# Patient Record
Sex: Female | Born: 1995 | Race: Black or African American | Hispanic: No | Marital: Single | Smoking: Never smoker
Health system: Southern US, Community
[De-identification: ages and names within clinical notes are randomized; demographics above are authoritative.]

## PROBLEM LIST (undated history)

## (undated) ENCOUNTER — Emergency Department (HOSPITAL_COMMUNITY): Discharge status: Against Medical Advice | Payer: BC Managed Care – HMO

## (undated) DIAGNOSIS — N63 Unspecified lump in unspecified breast: Secondary | ICD-10-CM

## (undated) DIAGNOSIS — K121 Other forms of stomatitis: Secondary | ICD-10-CM

## (undated) HISTORY — PX: NO PAST SURGERIES: SHX2092

## (undated) HISTORY — DX: Other forms of stomatitis: K12.1

---
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
## 2014-04-04 ENCOUNTER — Emergency Department (HOSPITAL_COMMUNITY)
Admission: EM | Admit: 2014-04-04 | Discharge: 2014-04-05 | Disposition: A | Discharge status: Home / Self Care | Payer: BLUE CROSS/BLUE SHIELD | Attending: Emergency Medicine | Admitting: Emergency Medicine

## 2014-04-04 ENCOUNTER — Encounter (HOSPITAL_COMMUNITY): Payer: Self-pay | Admitting: Emergency Medicine

## 2014-04-04 DIAGNOSIS — Z79899 Other long term (current) drug therapy: Secondary | ICD-10-CM | POA: Insufficient documentation

## 2014-04-04 DIAGNOSIS — Z720 Tobacco use: Secondary | ICD-10-CM | POA: Insufficient documentation

## 2014-04-04 DIAGNOSIS — R000 Tachycardia, unspecified: Secondary | ICD-10-CM | POA: Insufficient documentation

## 2014-04-04 DIAGNOSIS — K121 Other forms of stomatitis: Secondary | ICD-10-CM | POA: Insufficient documentation

## 2014-04-04 DIAGNOSIS — Z8619 Personal history of other infectious and parasitic diseases: Secondary | ICD-10-CM | POA: Insufficient documentation

## 2014-04-04 DIAGNOSIS — K1379 Other lesions of oral mucosa: Secondary | ICD-10-CM | POA: Diagnosis present

## 2014-04-04 DIAGNOSIS — Z3202 Encounter for pregnancy test, result negative: Secondary | ICD-10-CM | POA: Diagnosis not present

## 2014-04-04 LAB — BASIC METABOLIC PANEL
Anion gap: 9 (ref 5–15)
BUN: 7 mg/dL (ref 6–23)
CO2: 25 mmol/L (ref 19–32)
Calcium: 9.9 mg/dL (ref 8.4–10.5)
Chloride: 102 mmol/L (ref 96–112)
Creatinine, Ser: 0.87 mg/dL (ref 0.50–1.10)
GFR calc Af Amer: >90 mL/min (ref >90)
GFR calc non Af Amer: >90 mL/min (ref >90)
Glucose, Bld: 88 mg/dL (ref 70–99)
Potassium: 3.7 mmol/L (ref 3.5–5.1)
Sodium: 136 mmol/L (ref 135–145)

## 2014-04-04 LAB — CBC WITH DIFFERENTIAL/PLATELET
Basophils Absolute: 0 10*3/uL (ref 0.0–0.1)
Basophils Relative: 0 % (ref 0–1)
Eosinophils Absolute: 0.8 10*3/uL — ABNORMAL HIGH (ref 0.0–0.7)
Eosinophils Relative: 8 % — ABNORMAL HIGH (ref 0–5)
HCT: 36.7 % (ref 36.0–46.0)
Hemoglobin: 12.1 g/dL (ref 12.0–15.0)
Lymphocytes Relative: 14 % (ref 12–46)
Lymphs Abs: 1.4 10*3/uL (ref 0.7–4.0)
MCH: 27.3 pg (ref 26.0–34.0)
MCHC: 33 g/dL (ref 30.0–36.0)
MCV: 82.8 fL (ref 78.0–100.0)
Monocytes Absolute: 0.7 10*3/uL (ref 0.1–1.0)
Monocytes Relative: 7 % (ref 3–12)
Neutro Abs: 7.4 10*3/uL (ref 1.7–7.7)
Neutrophils Relative %: 71 % (ref 43–77)
Platelets: 391 10*3/uL (ref 150–400)
RBC: 4.43 MIL/uL (ref 3.87–5.11)
RDW: 12.9 % (ref 11.5–15.5)
WBC: 10.4 10*3/uL (ref 4.0–10.5)

## 2014-04-04 MED ORDER — HYDROMORPHONE HCL 1 MG/ML IJ SOLN
1.0000 mg | Freq: Once | INTRAMUSCULAR | Status: AC
  Administered 2014-04-04: 1 mg via INTRAVENOUS
  Filled 2014-04-04: qty 1

## 2014-04-04 MED ORDER — MAGIC MOUTHWASH W/LIDOCAINE: 5.0000 mL | Freq: Three times a day (TID) | ORAL | Status: DC | PRN

## 2014-04-04 MED ORDER — DEXTROSE 5 % AND 0.45 % NACL IV BOLUS
1000.0000 mL | Freq: Once | INTRAVENOUS | Status: AC
  Administered 2014-04-04: 1000 mL via INTRAVENOUS

## 2014-04-04 MED ORDER — HYDROCODONE-ACETAMINOPHEN 7.5-325 MG/15ML PO SOLN: 5.0000 mL | ORAL | Status: DC | PRN

## 2014-04-04 MED ORDER — MAGIC MOUTHWASH
5.0000 mL | Freq: Once | ORAL | Status: AC
  Administered 2014-04-04: 5 mL via ORAL
  Filled 2014-04-04 (×2): qty 5

## 2014-04-04 MED ORDER — ACETAMINOPHEN 325 MG PO TABS
650.0000 mg | ORAL_TABLET | Freq: Four times a day (QID) | ORAL | Status: DC | PRN
  Administered 2014-04-04: 650 mg via ORAL
  Filled 2014-04-04: qty 2

## 2014-04-04 MED ORDER — KETOROLAC TROMETHAMINE 15 MG/ML IJ SOLN
15.0000 mg | Freq: Once | INTRAMUSCULAR | Status: AC
  Administered 2014-04-04: 15 mg via INTRAVENOUS
  Filled 2014-04-04: qty 1

## 2014-04-04 NOTE — ED Provider Notes (Signed)
CSN: [id number]     Arrival date & time 04/04/14  1947 History   First MD Initiated Contact with Patient 04/04/14 2050     Chief Complaint  Patient presents with  . Oral Swelling     (Consider location/radiation/quality/duration/timing/severity/associated sxs/prior Treatment) HPI   18yF with mouth pain/sores. Has hx of herpes stomatitis and reports that she gets recurrence ~2x year. Most recently began about 2 weeks ago. Has been taking acyclovir without improvement. Eating/drinking but decreased 2/2 pain. Intermittent subjective fever. No respiratory complaints. No other rash/lesions aside from lips/mouth.   Past Medical History  Diagnosis Date  . Herpes simplex infection    History reviewed. No pertinent past surgical history. History reviewed. No pertinent family history. History  Substance Use Topics  . Smoking status: Current Some Day Smoker  . Smokeless tobacco: Not on file  . Alcohol Use: No   OB History    No data available     Review of Systems  All systems reviewed and negative, other than as noted in HPI.   Allergies  Tomato  Home Medications   Prior to Admission medications   Medication Sig Start Date End Date Taking? Authorizing Provider  acyclovir (ZOVIRAX) 200 MG capsule Take 1 capsule (200 mg total) by mouth 5 (five) times daily. For three more days. 09/27/13   Jennifer L Piepenbrink, PA-C  HYDROcodone-acetaminophen (HYCET) 7.5-325 mg/15 ml solution Take 15 mLs by mouth every 8 (eight) hours as needed for moderate pain. 09/27/13   Jennifer L Piepenbrink, PA-C  lidocaine (XYLOCAINE) 2 % solution Use as directed 20 mLs in the mouth or throat as needed for mouth pain. 09/27/13   Jennifer L Piepenbrink, PA-C   BP 128/78 mmHg  Pulse 128  Temp(Src) 100 F (37.8 C) (Oral)  Resp 18  SpO2 98%  LMP 03/14/2014 Physical Exam  Constitutional: She appears well-developed and well-nourished. No distress.  HENT:  Head: Normocephalic and atraumatic.  Superficial  ulcerations to lips and oral mucosa. Posterior pharynx somewhat injected but without obvious lesion. Handling secretions. Pt speaking with minimizing lip movement because of pain. Can open mouth fully/no trismus. Neck supple. No nodes.   Eyes: Conjunctivae are normal. Right eye exhibits no discharge. Left eye exhibits no discharge.  Neck: Neck supple.  Cardiovascular: Regular rhythm and normal heart sounds.  Exam reveals no gallop and no friction rub.   No murmur heard. tachycardic  Pulmonary/Chest: Effort normal and breath sounds normal. No respiratory distress.  Abdominal: Soft. She exhibits no distension. There is no tenderness.  Musculoskeletal: She exhibits no edema or tenderness.  Neurological: She is alert.  Skin: Skin is warm and dry.  Psychiatric: She has a normal mood and affect. Her behavior is normal. Thought content normal.  Nursing note and vitals reviewed.   ED Course  Procedures (including critical care time) Labs Review Labs Reviewed  CBC WITH DIFFERENTIAL/PLATELET - Abnormal; Notable for the following:    Eosinophils Relative 8 (*)    Eosinophils Absolute 0.8 (*)    All other components within normal limits  BASIC METABOLIC PANEL  POC URINE PREG, ED    Imaging Review No results found.   EKG Interpretation None      MDM   Final diagnoses:  Stomatitis    18yF with stomatitis. Poor PO intake 2/2 this which I suspect is reason for tachycardia. Hx of same. Handling secretions. IVF. Pain meds. On acyclovir. Add magic mouth wash/PRN pain meds.     Raeford RazorStephen Mckensie Scotti, MD 04/12/14 1006

## 2014-04-04 NOTE — Discharge Instructions (Signed)
Stomatitis Stomatitis is an inflammation of the mucous lining of the mouth. It can affect part of the mouth or the whole mouth. The intensity of symptoms can range from mild to severe. It can affect your cheek, teeth, gums, lips, or tongue. In almost all cases, the lining of the mouth becomes swollen, red, and painful. Painful ulcers can develop in your mouth. Stomatitis recurs in some people. CAUSES  There are many common causes of stomatitis. They include: Viruses (such as cold sores or shingles). Canker sores. Bacteria (such as ulcerative gingivitis or sexually transmitted diseases). Fungus or yeast (such as candidiasis or oral thrush). Poor oral hygiene and poor nutrition (Vincent's stomatitis or trench mouth). Lack of vitamin B, vitamin C, or niacin. Dentures or braces that do not fit properly. High acid foods (uncommon). Sharp or broken teeth. Cheek biting. Breathing through the mouth. Chewing tobacco. Allergy to toothpaste, mouthwash, candy, gum, lipstick, or some medicines. Burning your mouth with hot drinks or food. Exposure to dyes, heavy metals, acid fumes, or mineral dust. SYMPTOMS  Painful ulcers in the mouth. Blisters in the mouth. Bleeding gums. Swollen gums. Irritability. Bad breath. Bad taste in the mouth. Fever. Trouble eating because of burning and pain in the mouth. DIAGNOSIS  Your caregiver will examine your mouth and look for bleeding gums and mouth ulcers. Your caregiver may ask you about the medicines you are taking. Your caregiver may suggest a blood test and tissue sample (biopsy) of the mouth ulcer or mass if either is present. This will help find the cause of your condition. TREATMENT  Your treatment will depend on the cause of your condition. Your caregiver will first try to treat your symptoms.  You may be given pain medicine. Topical anesthetic may be used to numb the area if you have severe pain. Your caregiver may prescribe antibiotic medicine if  you have a bacterial infection. Your caregiver may prescribe antifungal medicine if you have a fungal infection. You may need to take antiviral medicine if you have a viral infection like herpes. You may be asked to use medicated mouth rinses. Your caregiver will advise you about proper brushing and using a soft toothbrush. You also need to get your teeth cleaned regularly. HOME CARE INSTRUCTIONS  Maintain good oral hygiene. This is especially important for transplant patients. Brush your teeth carefully with a soft, nylon-bristled toothbrush. Floss at least 2 times a day. Clean your mouth after eating. Rinse your mouth with salt water 3 to 4 times a day. Gargle with cold water. Use topical numbing medicines to decrease pain if recommended by your caregiver. Stop smoking, and stop using chewing or smokeless tobacco. Avoid eating hot and spicy foods. Eat soft and bland food. Reduce your stress wherever possible. Eat healthy and nutritious foods. SEEK MEDICAL CARE IF:  Your symptoms persist or get worse. You develop new symptoms. Your mouth ulcers are present for more than 3 weeks. Your mouth ulcers come back frequently. You have increasing difficulty with normal eating and drinking. You have increasing fatigue or weakness. You develop loss of appetite or nausea. SEEK IMMEDIATE MEDICAL CARE IF:  You have a fever. You develop pain, redness, or sores around one or both eyes. You cannot eat or drink because of pain or other symptoms. You develop worsening weakness, or you faint. You develop vomiting or diarrhea. You develop chest pain, shortness of breath, or rapid and irregular heartbeats. MAKE SURE YOU: Understand these instructions. Will watch your condition. Will get help  right away if you are not doing well or get worse. Document Released: 11/24/2006 Document Revised: 04/21/2011 Document Reviewed: 09/05/2010 [facility] Patient Information 2015 [place], [place]. This information is  not intended to replace advice given to you by your health care provider. Make sure you discuss any questions you have with your health care provider.  ove with pain medicine.  You have a lot of bleeding in your mouth.  You develop new, open sores in your mouth.  You notice patches of pus forming in your mouth.  You cannot swallow solid food or liquids.  You have a fever. Document Released: 09/13/2010 Document Revised: 04/21/2011 Document Reviewed: 09/13/2010 [facility] Patient Information 2015 [place], [place]. This information is not intended to replace advice given to you by your health care provider. Make sure you discuss any questions you have with your health care provider.

## 2014-04-04 NOTE — ED Notes (Signed)
Pt reports twice a years she develops sores in mouth. Pt states she is on medication with no relief. Pt has lip swelling with blisters upon assessment. Pt denies any SOB.

## 2014-04-05 MED ORDER — MAGIC MOUTHWASH W/LIDOCAINE: 5.0000 mL | Freq: Three times a day (TID) | ORAL | Status: DC | PRN

## 2014-04-05 MED ORDER — HYDROCODONE-ACETAMINOPHEN 7.5-325 MG/15ML PO SOLN
5.0000 mL | ORAL | Status: AC | PRN
Start: 2014-04-05 — End: 2015-04-05

## 2015-03-27 ENCOUNTER — Ambulatory Visit (INDEPENDENT_AMBULATORY_CARE_PROVIDER_SITE_OTHER): Payer: BLUE CROSS/BLUE SHIELD | Admitting: Family Medicine

## 2015-03-27 VITALS — BP 116/75 | HR 79 | Temp 98.3°F | Resp 16 | Ht 66.0 in | Wt 184.4 lb

## 2015-03-27 DIAGNOSIS — Z0000 Encounter for general adult medical examination without abnormal findings: Secondary | ICD-10-CM | POA: Diagnosis not present

## 2015-03-27 DIAGNOSIS — Z113 Encounter for screening for infections with a predominantly sexual mode of transmission: Secondary | ICD-10-CM

## 2015-03-27 DIAGNOSIS — Z789 Other specified health status: Secondary | ICD-10-CM

## 2015-03-27 NOTE — Progress Notes (Addendum)
[facility] [address] [place] [address] [phone]  Date:  03/27/2015   Name:  Stephanie Sanford   DOB:  12-31-1995   MRN:  [id number]  PCP:  No PCP Per Patient    Chief Complaint: Annual Exam   History of Present Illness:  Stephanie Sanford is a 20 y.o. 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  Would like STI screening but does not desire any other testing or evaluation today  She does not have any significant health problems, no significant health history.    Non smoker, no alcohol, no drugs  LMP 2/7 No problems with lifting or other physical activity that may be required to work with children She declines a flu shot today  There are no active problems to display for this patient.   Past Medical History  Diagnosis Date  . Herpes simplex infection     History reviewed. No pertinent past surgical history.  Social History  Substance Use Topics  . Smoking status: Never Smoker   . Smokeless tobacco: None  . Alcohol Use: No    History reviewed. No pertinent family history.  Allergies  Allergen Reactions  . Tomato Hives and Swelling    Medication list has been reviewed and updated.  Current Outpatient Prescriptions on File Prior to Visit  Medication Sig Dispense Refill  . acyclovir (ZOVIRAX) 800 MG tablet Take 800 mg by mouth 2 (two) times daily. For 5 days.    . Alum & Mag Hydroxide-Simeth (MAGIC MOUTHWASH W/LIDOCAINE) SOLN Take 5 mLs by mouth 3 (three) times daily as needed for mouth pain. 234 mL 0  . lidocaine (XYLOCAINE) 2 % solution Use as directed 20 mLs in the mouth or throat as needed for mouth pain. 100 mL 0  . acetaminophen (TYLENOL) 500 MG tablet Take 500 mg by mouth every 6 (six) hours as needed for mild pain. Reported on 03/27/2015    . acyclovir (ZOVIRAX) 200 MG capsule  Take 1 capsule (200 mg total) by mouth 5 (five) times daily. For three more days. (Patient not taking: Reported on 03/27/2015) 15 capsule 0  . HYDROcodone-acetaminophen (HYCET) 7.5-325 mg/15 ml solution Take 5-10 mLs by mouth every 4 (four) hours as needed for moderate pain. (Patient not taking: Reported on 03/27/2015) 120 mL 0  . medroxyPROGESTERone (DEPO-PROVERA) 150 MG/ML injection Inject 150 mg into the muscle every 3 (three) months. Reported on 03/27/2015     No current facility-administered medications on file prior to visit.    Review of Systems:  As per HPI- otherwise negative.   Physical Examination: Filed Vitals:   03/27/15 1137  BP: 116/75  Pulse: 79  Temp: 98.3 F (36.8 C)  Resp: 16   Filed Vitals:   03/27/15 1137  Height:  (1.676 m)  Weight: 184 lb 6.4 oz (83.643 kg)   Body mass index is 29.78 kg/(m^2). Ideal Body Weight: Weight in (lb) to have BMI = 25: 154.6  GEN: WDWN, NAD, Non-toxic, A & O x 3, overweight, looks well HEENT: Atraumatic, Normocephalic. Neck supple. No masses, No LAD.  Bilateral TM wnl, oropharynx normal.  PEERL,EOMI.   Ears and Nose: No external deformity. CV: RRR, No M/G/R. No JVD. No thrill. No extra heart sounds. PULM: CTA B, no wheezes, crackles, rhonchi. No retractions. No resp. distress.  No accessory muscle use. ABD: S, NT, ND EXTR: No c/c/e Normal strength and DTR of all extremities  NEURO Normal gait.  PSYCH: Normally interactive. Conversant. Not depressed or anxious appearing.  Calm demeanor.    Assessment and Plan: Screening for STD (sexually transmitted disease) - Plan: GC/Chlamydia Probe Amp, HIV antibody, RPR, Hepatitis B surface antibody, Hepatitis B surface antigen, Hepatitis C antibody  Physical exam  PE today, completed form STI panel pending and I will be in touch with her labs She declines a flu shot today Declines flu shot today Signed Abbe Amsterdam, MD  Called and [facility] on 2/16- labs negative will send a  copy  Results for orders placed or performed in visit on 03/27/15  GC/Chlamydia Probe Amp  Result Value Ref Range   CT Probe RNA NOT DETECTED    GC Probe RNA NOT DETECTED   HIV antibody  Result Value Ref Range   HIV 1&2 Ab, 4th Generation NONREACTIVE NONREACTIVE  RPR  Result Value Ref Range   RPR Ser Ql NON REAC NON REAC  Hepatitis B surface antibody  Result Value Ref Range   Hepatitis B-Post 37.5 mIU/mL  Hepatitis B surface antigen  Result Value Ref Range   Hepatitis B Surface Ag NEGATIVE NEGATIVE  Hepatitis C antibody  Result Value Ref Range   HCV Ab NEGATIVE NEGATIVE   Called on 2/16-

## 2015-03-27 NOTE — Patient Instructions (Signed)
It was good to see you today- best of luck with the rest of your education!  I would encourage you to get an annual flu shot in the future to help protect yourself and others. Take care!

## 2015-03-28 LAB — RPR

## 2015-03-28 LAB — HEPATITIS C ANTIBODY: HCV Ab: NEGATIVE

## 2015-03-28 LAB — HEPATITIS B SURFACE ANTIGEN: HEP B S AG: NEGATIVE

## 2015-03-28 LAB — HIV ANTIBODY (ROUTINE TESTING W REFLEX): HIV 1&2 Ab, 4th Generation: NONREACTIVE

## 2015-03-28 LAB — HEPATITIS B SURFACE ANTIBODY, QUANTITATIVE: Hepatitis B-Post: 37.5 m[IU]/mL

## 2015-03-29 ENCOUNTER — Encounter: Payer: Self-pay | Admitting: Family Medicine

## 2015-03-29 DIAGNOSIS — Z789 Other specified health status: Secondary | ICD-10-CM | POA: Insufficient documentation

## 2015-03-29 LAB — GC/CHLAMYDIA PROBE AMP
CT PROBE, AMP APTIMA: NOT DETECTED
GC PROBE AMP APTIMA: NOT DETECTED

## 2016-02-11 NOTE — L&D Delivery Note (Signed)
Delivery Note Patient is a 21 y.o. now [id number] who admitted for SOL, now s/p NSVD at [id number] At 1:28 PM  Patient complete and pushing. Head delivered LOA. No nuchal cord present. Shoulder and body delivered in usual fashion. Infant to mother's abdomen. Cord clamped x 2 after 1-minute delay, and cut by family member. Cord blood drawn. Placenta delivered spontaneously with gentle cord traction. Fundus firm with massage and Pitocin. Perineum inspected and found to have no lacerations. L labial laceration noted to be hemostatic.  APGAR: 8, 9; weight  pending Placenta status: intact   Cord:  3-vessel  Anesthesia:  Epidural Episiotomy: None Lacerations:Labial Suture Repair: none Est. Blood Loss (mL):  150  Mom to postpartum.  Baby to Couplet care / Skin to Skin.  Kandra NicolasJulie P Degele 09/02/2016, 1:47 PM

## 2016-07-10 ENCOUNTER — Ambulatory Visit (INDEPENDENT_AMBULATORY_CARE_PROVIDER_SITE_OTHER): Payer: Medicaid Other | Admitting: Certified Nurse Midwife

## 2016-07-10 ENCOUNTER — Encounter: Payer: Self-pay | Admitting: Certified Nurse Midwife

## 2016-07-10 ENCOUNTER — Other Ambulatory Visit: Payer: Medicaid Other

## 2016-07-10 VITALS — BP 104/68 | HR 99 | Wt 195.0 lb

## 2016-07-10 DIAGNOSIS — Z3483 Encounter for supervision of other normal pregnancy, third trimester: Secondary | ICD-10-CM

## 2016-07-10 DIAGNOSIS — D573 Sickle-cell trait: Secondary | ICD-10-CM | POA: Insufficient documentation

## 2016-07-10 DIAGNOSIS — R87612 Low grade squamous intraepithelial lesion on cytologic smear of cervix (LGSIL): Secondary | ICD-10-CM

## 2016-07-10 DIAGNOSIS — N6310 Unspecified lump in the right breast, unspecified quadrant: Secondary | ICD-10-CM

## 2016-07-10 DIAGNOSIS — Z3490 Encounter for supervision of normal pregnancy, unspecified, unspecified trimester: Secondary | ICD-10-CM

## 2016-07-10 DIAGNOSIS — B002 Herpesviral gingivostomatitis and pharyngotonsillitis: Secondary | ICD-10-CM

## 2016-07-10 DIAGNOSIS — O219 Vomiting of pregnancy, unspecified: Secondary | ICD-10-CM

## 2016-07-10 HISTORY — DX: Low grade squamous intraepithelial lesion on cytologic smear of cervix (LGSIL): R87.612

## 2016-07-10 HISTORY — DX: Herpesviral gingivostomatitis and pharyngotonsillitis: B00.2

## 2016-07-10 MED ORDER — ONDANSETRON HCL 8 MG PO TABS: 8.0000 mg | ORAL_TABLET | Freq: Three times a day (TID) | ORAL | 2 refills | Status: DC | PRN

## 2016-07-10 MED ORDER — PRENATE PIXIE 10-0.6-0.4-200 MG PO CAPS: 1.0000 | ORAL_CAPSULE | Freq: Every day | ORAL | 12 refills | Status: DC

## 2016-07-10 NOTE — Progress Notes (Signed)
Subjective:    Stephanie Sanford is being seen today for her first obstetrical visit.  This is a planned pregnancy. She is at [redacted]w[redacted]d gestation. Her obstetrical history is significant for obesity. Relationship with FOB: significant other, living together. Patient does intend to breast feed. Pregnancy history fully reviewed.  The information documented in the HPI was reviewed and verified.  Menstrual History: OB History    Gravida Para Term Preterm AB Living   4       3     SAB TAB Ectopic Multiple Live Births   2 1            Patient's last menstrual period was 11/30/2015.    Past Medical History:  Diagnosis Date  . Mouth ulcers     History reviewed. No pertinent surgical history.   (Not in a hospital admission) Allergies  Allergen Reactions  . Tomato Hives and Swelling    Social History  Substance Use Topics  . Smoking status: Never Smoker  . Smokeless tobacco: Never Used  . Alcohol use No    History reviewed. No pertinent family history.   Review of Systems Constitutional: negative for weight loss Gastrointestinal: negative for vomiting Genitourinary:negative for genital lesions and vaginal discharge and dysuria Musculoskeletal:negative for back pain Behavioral/Psych: negative for abusive relationship, depression, illegal drug usage and tobacco use    Objective:    BP 104/68   Pulse 99   Wt 195 lb (88.5 kg)   LMP 11/30/2015   BMI 31.47 kg/m  General Appearance:    Alert, cooperative, no distress, appears stated age  Head:    Normocephalic, without obvious abnormality, atraumatic  Eyes:    PERRL, conjunctiva/corneas clear, EOM's intact, fundi    benign, both eyes  Ears:    Normal TM's and external ear canals, both ears  Nose:   Nares normal, septum midline, mucosa normal, no drainage    or sinus tenderness  Throat:   Lips, mucosa, and tongue normal; teeth and gums normal  Neck:   Supple, symmetrical, trachea midline, no adenopathy;    thyroid:  no  enlargement/tenderness/nodules; no carotid   bruit or JVD  Back:     Symmetric, no curvature, ROM normal, no CVA tenderness  Lungs:     Clear to auscultation bilaterally, respirations unlabored  Chest Wall:    No tenderness or deformity   Heart:    Regular rate and rhythm, S1 and S2 normal, no murmur, rub   or gallop  Breast Exam:    No tenderness, masses, or nipple abnormality on left side.  Right side mass noted sternal boarder 3 o'clock region.   Abdomen:     Soft, non-tender, bowel sounds active all four quadrants,    no masses, no organomegaly  Genitalia:    Normal female without lesion, discharge or tenderness  Extremities:   Extremities normal, atraumatic, no cyanosis or edema  Pulses:   2+ and symmetric all extremities  Skin:   Skin color, texture, turgor normal, no rashes or lesions  Lymph nodes:   Cervical, supraclavicular, and axillary nodes normal  Neurologic:   CNII-XII intact, normal strength, sensation and reflexes    throughout      Lab Review Urine pregnancy test Labs reviewed yes Radiologic studies reviewed no Assessment & Plan    Pregnancy at [redacted]w[redacted]d weeks    1. Encounter for supervision of normal pregnancy, antepartum, unspecified gravidity      Doing well.  - Glucose Tolerance, 2 Hours w/1 Hour - [place]  MFM OB COMP + 14 WK; Future - Varicella zoster antibody, IgG - Culture, OB Urine - Hemoglobin A1c - Obstetric Panel, Including HIV - Cystic Fibrosis Mutation 97 - Prenat-FeAsp-Meth-FA-DHA w/o A (PRENATE PIXIE) 10-0.6-0.4-200 MG CAPS; Take 1 tablet by mouth daily.  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 Sickle cell trait (HCC)      FOB has not been tested previously  4. Breast mass, right     Hematoma, f/u scheduled for August  5. Oral herpes     Takes Acyclovir for it occasionally, no outbreaks this pregnancy  6. Nausea and vomiting during pregnancy     Has tried Diclegis -  ondansetron (ZOFRAN) 8 MG tablet; Take 1 tablet (8 mg total) by mouth every 8 (eight) hours as needed for nausea or vomiting.  Dispense: 40 tablet; Refill: 2   Prenatal vitamins.  Counseling provided regarding continued use of seat belts, cessation of alcohol consumption, smoking or use of illicit drugs; infection precautions i.e., influenza/TDAP immunizations, toxoplasmosis,CMV, parvovirus, listeria and varicella; workplace safety, exercise during pregnancy; routine dental care, safe medications, sexual activity, hot tubs, saunas, pools, travel, caffeine use, fish and methlymercury, potential toxins, hair treatments, varicose veins Weight gain recommendations per IOM guidelines reviewed: underweight/BMI< 18.5--> gain 28 - 40 lbs; normal weight/BMI 18.5 - 24.9--> gain 25 - 35 lbs; overweight/BMI 25 - 29.9--> gain 15 - 25 lbs; obese/BMI >30->gain  11 - 20 lbs Problem list reviewed and updated. FIRST/CF mutation testing/NIPT/QUAD SCREEN/fragile X/Ashkenazi Jewish population testing/Spinal muscular atrophy discussed: ordered. Role of ultrasound in pregnancy discussed; fetal survey: ordered. Amniocentesis discussed: not indicated.   Meds ordered this encounter  Medications  . Prenatal Vit-Fe Fumarate-FA (MULTIVITAMIN-PRENATAL) 27-0.8 MG TABS tablet    Sig: Take 1 tablet by mouth daily at 12 noon.  . ondansetron (ZOFRAN) 8 MG tablet    Sig: Take 1 tablet (8 mg total) by mouth every 8 (eight) hours as needed for nausea or vomiting.    Dispense:  40 tablet    Refill:  2  . Prenat-FeAsp-Meth-FA-DHA w/o A (PRENATE PIXIE) 10-0.6-0.4-200 MG CAPS    Sig: Take 1 tablet by mouth daily.    Dispense:  30 capsule    Refill:  12    Please process coupon: Rx BIN: [id number], RxPCN: [id number], RxGRP: [id number], Rx: [id number]: [id number]  SUF: 01   Orders Placed This Encounter  Procedures  . Culture, OB Urine  . US MFM OB COMP + 14 WK    Standing Status:   Future    Standing Expiration Date:   09/09/2017    Order Specific  Question:   Reason for Exam (SYMPTOM  OR DIAGNOSIS REQUIRED)    Answer:   fetal anatomy scan    Order Specific Question:   Preferred imaging location?    Answer:   [facility]  . Glucose Tolerance, 2 Hours w/1 Hour  . Varicella zoster antibody, IgG  . Hemoglobin A1c  . Obstetric Panel, Including HIV  . Cystic Fibrosis Mutation 97  . MaterniT21 PLUS Core+SCA    Order Specific Question:   Is the patient insulin dependent?    Answer:   No    Order Specific Question:   Please enter gestational age. This should be expressed as weeks AND days, i.e. 16w 6d. Enter weeks here. Enter days in next question.    Answer:   4631    Order Specific Question:  Please enter gestational age. This should be expressed as weeks AND days, i.e. 16w 6d. Enter days here. Enter weeks in previous question.    Answer:   6    Order Specific Question:   How was gestational age calculated?    Answer:   LMP    Order Specific Question:   Please give the date of LMP OR Ultrasound OR Estimated date of delivery.    Answer:   09/05/2016    Order Specific Question:   Number of Fetuses (Type of Pregnancy):    Answer:   1    Order Specific Question:   Indications for performing the test? (please choose all that apply):    Answer:   Routine screening    Order Specific Question:   Other Indications? (Y=Yes, N=No)    Answer:   N    Order Specific Question:   If this is a repeat specimen, please indicate the reason:    Answer:   Not indicated    Order Specific Question:   Please specify the patient's race: (C=White/Caucasion, B=Black, I=Native American, A=Asian, H=Hispanic, O=Other, U=Unknown)    Answer:   B    Order Specific Question:   Donor Egg - indicate if the egg was obtained from in vitro fertilization.    Answer:   N    Order Specific Question:   Age of Egg Donor.    Answer:   89    Order Specific Question:   Prior Down Syndrome/ONTD screening during current pregnancy.    Answer:   N    Order Specific Question:    Prior First Trimester Testing    Answer:   N    Order Specific Question:   Prior Second Trimester Testing    Answer:   N    Order Specific Question:   Family History of Neural Tube Defects    Answer:   N    Order Specific Question:   Prior Pregnancy with Down Syndrome    Answer:   N    Order Specific Question:   Please give the patient's weight (in pounds)    Answer:   195    Follow up in 2 weeks. 50% of 30 min visit spent on counseling and coordination of care.

## 2016-07-11 LAB — OBSTETRIC PANEL, INCLUDING HIV
Antibody Screen: NEGATIVE
BASOS ABS: 0 10*3/uL (ref 0.0–0.2)
Basos: 0 %
EOS (ABSOLUTE): 0.5 10*3/uL — ABNORMAL HIGH (ref 0.0–0.4)
Eos: 6 %
HEP B S AG: NEGATIVE
HIV SCREEN 4TH GENERATION: NONREACTIVE
Hematocrit: 29.1 % — ABNORMAL LOW (ref 34.0–46.6)
Hemoglobin: 9.5 g/dL — ABNORMAL LOW (ref 11.1–15.9)
Immature Grans (Abs): 0 10*3/uL (ref 0.0–0.1)
Immature Granulocytes: 0 %
Lymphocytes Absolute: 1.6 10*3/uL (ref 0.7–3.1)
Lymphs: 19 %
MCH: 26.5 pg — ABNORMAL LOW (ref 26.6–33.0)
MCHC: 32.6 g/dL (ref 31.5–35.7)
MCV: 81 fL (ref 79–97)
MONOCYTES: 7 %
Monocytes Absolute: 0.6 10*3/uL (ref 0.1–0.9)
NEUTROS ABS: 5.5 10*3/uL (ref 1.4–7.0)
Neutrophils: 68 %
PLATELETS: 356 10*3/uL (ref 150–379)
RBC: 3.58 x10E6/uL — ABNORMAL LOW (ref 3.77–5.28)
RDW: 13.2 % (ref 12.3–15.4)
RPR Ser Ql: NONREACTIVE
RUBELLA: 4.51 {index} (ref >0.99)
Rh Factor: POSITIVE
WBC: 8.3 10*3/uL (ref 3.4–10.8)

## 2016-07-11 LAB — GLUCOSE TOLERANCE, 2 HOURS W/ 1HR
Glucose, 1 hour: 122 mg/dL (ref 65–179)
Glucose, 2 hour: 94 mg/dL (ref 65–152)
Glucose, Fasting: 78 mg/dL (ref 65–91)

## 2016-07-11 LAB — HEMOGLOBIN A1C
ESTIMATED AVERAGE GLUCOSE: 94 mg/dL
Hgb A1c MFr Bld: 4.9 % (ref 4.8–5.6)

## 2016-07-11 LAB — VARICELLA ZOSTER ANTIBODY, IGG: Varicella zoster IgG: 540 index (ref >165)

## 2016-07-13 LAB — URINE CULTURE, OB REFLEX

## 2016-07-13 LAB — CULTURE, OB URINE

## 2016-07-14 ENCOUNTER — Other Ambulatory Visit: Payer: Self-pay | Admitting: Certified Nurse Midwife

## 2016-07-14 DIAGNOSIS — Z3480 Encounter for supervision of other normal pregnancy, unspecified trimester: Secondary | ICD-10-CM

## 2016-07-14 DIAGNOSIS — O99013 Anemia complicating pregnancy, third trimester: Secondary | ICD-10-CM

## 2016-07-14 MED ORDER — CITRANATAL BLOOM 90-1 MG PO TABS: 1.0000 | ORAL_TABLET | Freq: Every day | ORAL | 12 refills | Status: DC

## 2016-07-15 ENCOUNTER — Encounter (HOSPITAL_COMMUNITY): Payer: Self-pay | Admitting: Certified Nurse Midwife

## 2016-07-16 ENCOUNTER — Other Ambulatory Visit: Payer: Self-pay | Admitting: Certified Nurse Midwife

## 2016-07-16 DIAGNOSIS — Z3480 Encounter for supervision of other normal pregnancy, unspecified trimester: Secondary | ICD-10-CM

## 2016-07-16 LAB — CYSTIC FIBROSIS MUTATION 97: GENE DIS ANAL CARRIER INTERP BLD/T-IMP: NOT DETECTED

## 2016-07-19 LAB — MATERNIT21 PLUS CORE+SCA
CHROMOSOME 18: NEGATIVE
CHROMOSOME 21: NEGATIVE
Chromosome 13: NEGATIVE
Y CHROMOSOME: DETECTED

## 2016-07-21 ENCOUNTER — Other Ambulatory Visit: Payer: Self-pay | Admitting: Certified Nurse Midwife

## 2016-07-21 DIAGNOSIS — Z3480 Encounter for supervision of other normal pregnancy, unspecified trimester: Secondary | ICD-10-CM

## 2016-07-22 ENCOUNTER — Ambulatory Visit (HOSPITAL_COMMUNITY)
Admission: RE | Admit: 2016-07-22 | Discharge: 2016-07-22 | Disposition: A | Discharge status: Home / Self Care | Payer: Medicaid Other | Source: Ambulatory Visit | Attending: Certified Nurse Midwife | Admitting: Certified Nurse Midwife

## 2016-07-22 DIAGNOSIS — Z3A33 33 weeks gestation of pregnancy: Secondary | ICD-10-CM | POA: Diagnosis not present

## 2016-07-22 DIAGNOSIS — Z3490 Encounter for supervision of normal pregnancy, unspecified, unspecified trimester: Secondary | ICD-10-CM | POA: Diagnosis not present

## 2016-07-23 ENCOUNTER — Other Ambulatory Visit: Payer: Self-pay | Admitting: Certified Nurse Midwife

## 2016-07-23 DIAGNOSIS — Z3480 Encounter for supervision of other normal pregnancy, unspecified trimester: Secondary | ICD-10-CM

## 2016-07-25 ENCOUNTER — Encounter: Payer: Self-pay | Admitting: Certified Nurse Midwife

## 2016-07-25 ENCOUNTER — Ambulatory Visit (INDEPENDENT_AMBULATORY_CARE_PROVIDER_SITE_OTHER): Payer: Medicaid Other | Admitting: Certified Nurse Midwife

## 2016-07-25 VITALS — BP 116/72 | HR 101 | Wt 198.0 lb

## 2016-07-25 DIAGNOSIS — D573 Sickle-cell trait: Secondary | ICD-10-CM

## 2016-07-25 DIAGNOSIS — Z3483 Encounter for supervision of other normal pregnancy, third trimester: Secondary | ICD-10-CM

## 2016-07-25 DIAGNOSIS — R87612 Low grade squamous intraepithelial lesion on cytologic smear of cervix (LGSIL): Secondary | ICD-10-CM

## 2016-07-25 DIAGNOSIS — O99013 Anemia complicating pregnancy, third trimester: Secondary | ICD-10-CM

## 2016-07-25 DIAGNOSIS — Z3490 Encounter for supervision of normal pregnancy, unspecified, unspecified trimester: Secondary | ICD-10-CM

## 2016-07-25 NOTE — Patient Instructions (Addendum)
Contraception Choices Contraception (birth control) is the use of any methods or devices to prevent pregnancy. Below are some methods to help avoid pregnancy. Hormonal methods  Contraceptive implant. This is a thin, plastic tube containing progesterone hormone. It does not contain estrogen hormone. Your health care provider inserts the tube in the inner part of the upper arm. The tube can remain in place for up to 3 years. After 3 years, the implant must be removed. The implant prevents the ovaries from releasing an egg (ovulation), thickens the cervical mucus to prevent sperm from entering the uterus, and thins the lining of the inside of the uterus.  Progesterone-only injections. These injections are given every 3 months by your health care provider to prevent pregnancy. This synthetic progesterone hormone stops the ovaries from releasing eggs. It also thickens cervical mucus and changes the uterine lining. This makes it harder for sperm to survive in the uterus.  Birth control pills. These pills contain estrogen and progesterone hormone. They work by preventing the ovaries from releasing eggs (ovulation). They also cause the cervical mucus to thicken, preventing the sperm from entering the uterus. Birth control pills are prescribed by a health care provider.Birth control pills can also be used to treat heavy periods.  Minipill. This type of birth control pill contains only the progesterone hormone. They are taken every day of each month and must be prescribed by your health care provider.  Birth control patch. The patch contains hormones similar to those in birth control pills. It must be changed once a week and is prescribed by a health care provider.  Vaginal ring. The ring contains hormones similar to those in birth control pills. It is left in the vagina for 3 weeks, removed for 1 week, and then a new one is put back in place. The patient must be comfortable inserting and removing the ring from  the vagina.A health care provider's prescription is necessary.  Emergency contraception. Emergency contraceptives prevent pregnancy after unprotected sexual intercourse. This pill can be taken right after sex or up to 5 days after unprotected sex. It is most effective the sooner you take the pills after having sexual intercourse. Most emergency contraceptive pills are available without a prescription. Check with your pharmacist. Do not use emergency contraception as your only form of birth control. Barrier methods  Female condom. This is a thin sheath (latex or rubber) that is worn over the penis during sexual intercourse. It can be used with spermicide to increase effectiveness.  Female condom. This is a soft, loose-fitting sheath that is put into the vagina before sexual intercourse.  Diaphragm. This is a soft, latex, dome-shaped barrier that must be fitted by a health care provider. It is inserted into the vagina, along with a spermicidal jelly. It is inserted before intercourse. The diaphragm should be left in the vagina for 6 to 8 hours after intercourse.  Cervical cap. This is a round, soft, latex or plastic cup that fits over the cervix and must be fitted by a health care provider. The cap can be left in place for up to 48 hours after intercourse.  Sponge. This is a soft, circular piece of polyurethane foam. The sponge has spermicide in it. It is inserted into the vagina after wetting it and before sexual intercourse.  Spermicides. These are chemicals that kill or block sperm from entering the cervix and uterus. They come in the form of creams, jellies, suppositories, foam, or tablets. They do not require a prescription. They   are inserted into the vagina with an applicator before having sexual intercourse. The process must be repeated every time you have sexual intercourse. Intrauterine contraception  Intrauterine device (IUD). This is a T-shaped device that is put in a woman's uterus during  a menstrual period to prevent pregnancy. There are 2 types: ? Copper IUD. This type of IUD is wrapped in copper wire and is placed inside the uterus. Copper makes the uterus and fallopian tubes produce a fluid that kills sperm. It can stay in place for 10 years. ? Hormone IUD. This type of IUD contains the hormone progestin (synthetic progesterone). The hormone thickens the cervical mucus and prevents sperm from entering the uterus, and it also thins the uterine lining to prevent implantation of a fertilized egg. The hormone can weaken or kill the sperm that get into the uterus. It can stay in place for 3-5 years, depending on which type of IUD is used. Permanent methods of contraception  Female tubal ligation. This is when the woman's fallopian tubes are surgically sealed, tied, or blocked to prevent the egg from traveling to the uterus.  Hysteroscopic sterilization. This involves placing a small coil or insert into each fallopian tube. Your doctor uses a technique called hysteroscopy to do the procedure. The device causes scar tissue to form. This results in permanent blockage of the fallopian tubes, so the sperm cannot fertilize the egg. It takes about 3 months after the procedure for the tubes to become blocked. You must use another form of birth control for these 3 months.  Female sterilization. This is when the female has the tubes that carry sperm tied off (vasectomy).This blocks sperm from entering the vagina during sexual intercourse. After the procedure, the man can still ejaculate fluid (semen). Natural planning methods  Natural family planning. This is not having sexual intercourse or using a barrier method (condom, diaphragm, cervical cap) on days the woman could become pregnant.  Calendar method. This is keeping track of the length of each menstrual cycle and identifying when you are fertile.  Ovulation method. This is avoiding sexual intercourse during ovulation.  Symptothermal method.  This is avoiding sexual intercourse during ovulation, using a thermometer and ovulation symptoms.  Post-ovulation method. This is timing sexual intercourse after you have ovulated. Regardless of which type or method of contraception you choose, it is important that you use condoms to protect against the transmission of sexually transmitted infections (STIs). Talk with your health care provider about which form of contraception is most appropriate for you. This information is not intended to replace advice given to you by your health care provider. Make sure you discuss any questions you have with your health care provider. Document Released: 01/27/2005 Document Revised: 07/05/2015 Document Reviewed: 07/22/2012 Elsevier Interactive Patient Education  2017 [employer] Inc[employer]  

## 2016-07-25 NOTE — Progress Notes (Signed)
   PRENATAL VISIT NOTE  Subjective:  Stephanie Sanford is a 21 y.o. G4P0030 at 7622w0d being seen today for ongoing prenatal care.  She is currently monitored for the following issues for this low-risk pregnancy and has Hepatitis B immune; Supervision of normal pregnancy, antepartum; LGSIL on Pap smear of cervix; Sickle cell trait (HCC); Breast mass, right; Oral herpes; and Anemia affecting pregnancy in third trimester on her problem list.  Patient reports no complaints.  Contractions: Not present. Vag. Bleeding: None.  Movement: Present. Denies leaking of fluid.   The following portions of the patient's history were reviewed and updated as appropriate: allergies, current medications, past family history, past medical history, past social history, past surgical history and problem list. Problem list updated.  Objective:   Vitals:   07/25/16 0957  BP: 116/72  Pulse: (!) 101  Weight: 198 lb (89.8 kg)    Fetal Status: Fetal Heart Rate (bpm): 141 Fundal Height: 35 cm Movement: Present     General:  Alert, oriented and cooperative. Patient is in no acute distress.  Skin: Skin is warm and dry. No rash noted.   Cardiovascular: Normal heart rate noted  Respiratory: Normal respiratory effort, no problems with respiration noted  Abdomen: Soft, gravid, appropriate for gestational age. Pain/Pressure: Absent     Pelvic:  Cervical exam deferred        Extremities: Normal range of motion.  Edema: Trace  Mental Status: Normal mood and affect. Normal behavior. Normal judgment and thought content.   Assessment and Plan:  Pregnancy: G4P0030 at 4022w0d  1. Encounter for supervision of normal pregnancy, antepartum, unspecified gravidity     Doing well.  Late prenatal care/limited prenatal care. @31  weeks, second visit today. 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 Sickle cell trait (HCC)        4. Anemia affecting pregnancy in third trimester     Taking   bloom.   Preterm labor symptoms and general obstetric precautions including but not limited to vaginal bleeding, contractions, leaking of fluid and fetal movement were reviewed in detail with the patient. Please refer to After Visit Summary for other counseling recommendations.  Return in about 1 week (around 08/01/2016) for ROB.   Roe Coombsachelle A Ishika Chesterfield, CNM

## 2016-07-31 ENCOUNTER — Ambulatory Visit (INDEPENDENT_AMBULATORY_CARE_PROVIDER_SITE_OTHER): Payer: Medicaid Other | Admitting: Obstetrics and Gynecology

## 2016-07-31 VITALS — BP 112/70 | HR 94 | Wt 200.0 lb

## 2016-07-31 DIAGNOSIS — Z3400 Encounter for supervision of normal first pregnancy, unspecified trimester: Secondary | ICD-10-CM

## 2016-07-31 DIAGNOSIS — R87612 Low grade squamous intraepithelial lesion on cytologic smear of cervix (LGSIL): Secondary | ICD-10-CM

## 2016-07-31 DIAGNOSIS — Z3483 Encounter for supervision of other normal pregnancy, third trimester: Secondary | ICD-10-CM

## 2016-07-31 NOTE — Patient Instructions (Signed)
Contraception Choices Contraception (birth control) is the use of any methods or devices to prevent pregnancy. Below are some methods to help avoid pregnancy. Hormonal methods  Contraceptive implant. This is a thin, plastic tube containing progesterone hormone. It does not contain estrogen hormone. Your health care provider inserts the tube in the inner part of the upper arm. The tube can remain in place for up to 3 years. After 3 years, the implant must be removed. The implant prevents the ovaries from releasing an egg (ovulation), thickens the cervical mucus to prevent sperm from entering the uterus, and thins the lining of the inside of the uterus.  Progesterone-only injections. These injections are given every 3 months by your health care provider to prevent pregnancy. This synthetic progesterone hormone stops the ovaries from releasing eggs. It also thickens cervical mucus and changes the uterine lining. This makes it harder for sperm to survive in the uterus.  Birth control pills. These pills contain estrogen and progesterone hormone. They work by preventing the ovaries from releasing eggs (ovulation). They also cause the cervical mucus to thicken, preventing the sperm from entering the uterus. Birth control pills are prescribed by a health care provider.Birth control pills can also be used to treat heavy periods.  Minipill. This type of birth control pill contains only the progesterone hormone. They are taken every day of each month and must be prescribed by your health care provider.  Birth control patch. The patch contains hormones similar to those in birth control pills. It must be changed once a week and is prescribed by a health care provider.  Vaginal ring. The ring contains hormones similar to those in birth control pills. It is left in the vagina for 3 weeks, removed for 1 week, and then a new one is put back in place. The patient must be comfortable inserting and removing the ring from  the vagina.A health care provider's prescription is necessary.  Emergency contraception. Emergency contraceptives prevent pregnancy after unprotected sexual intercourse. This pill can be taken right after sex or up to 5 days after unprotected sex. It is most effective the sooner you take the pills after having sexual intercourse. Most emergency contraceptive pills are available without a prescription. Check with your pharmacist. Do not use emergency contraception as your only form of birth control. Barrier methods  Female condom. This is a thin sheath (latex or rubber) that is worn over the penis during sexual intercourse. It can be used with spermicide to increase effectiveness.  Female condom. This is a soft, loose-fitting sheath that is put into the vagina before sexual intercourse.  Diaphragm. This is a soft, latex, dome-shaped barrier that must be fitted by a health care provider. It is inserted into the vagina, along with a spermicidal jelly. It is inserted before intercourse. The diaphragm should be left in the vagina for 6 to 8 hours after intercourse.  Cervical cap. This is a round, soft, latex or plastic cup that fits over the cervix and must be fitted by a health care provider. The cap can be left in place for up to 48 hours after intercourse.  Sponge. This is a soft, circular piece of polyurethane foam. The sponge has spermicide in it. It is inserted into the vagina after wetting it and before sexual intercourse.  Spermicides. These are chemicals that kill or block sperm from entering the cervix and uterus. They come in the form of creams, jellies, suppositories, foam, or tablets. They do not require a prescription. They   are inserted into the vagina with an applicator before having sexual intercourse. The process must be repeated every time you have sexual intercourse. Intrauterine contraception  Intrauterine device (IUD). This is a T-shaped device that is put in a woman's uterus during  a menstrual period to prevent pregnancy. There are 2 types: ? Copper IUD. This type of IUD is wrapped in copper wire and is placed inside the uterus. Copper makes the uterus and fallopian tubes produce a fluid that kills sperm. It can stay in place for 10 years. ? Hormone IUD. This type of IUD contains the hormone progestin (synthetic progesterone). The hormone thickens the cervical mucus and prevents sperm from entering the uterus, and it also thins the uterine lining to prevent implantation of a fertilized egg. The hormone can weaken or kill the sperm that get into the uterus. It can stay in place for 3-5 years, depending on which type of IUD is used. Permanent methods of contraception  Female tubal ligation. This is when the woman's fallopian tubes are surgically sealed, tied, or blocked to prevent the egg from traveling to the uterus.  Hysteroscopic sterilization. This involves placing a small coil or insert into each fallopian tube. Your doctor uses a technique called hysteroscopy to do the procedure. The device causes scar tissue to form. This results in permanent blockage of the fallopian tubes, so the sperm cannot fertilize the egg. It takes about 3 months after the procedure for the tubes to become blocked. You must use another form of birth control for these 3 months.  Female sterilization. This is when the female has the tubes that carry sperm tied off (vasectomy).This blocks sperm from entering the vagina during sexual intercourse. After the procedure, the man can still ejaculate fluid (semen). Natural planning methods  Natural family planning. This is not having sexual intercourse or using a barrier method (condom, diaphragm, cervical cap) on days the woman could become pregnant.  Calendar method. This is keeping track of the length of each menstrual cycle and identifying when you are fertile.  Ovulation method. This is avoiding sexual intercourse during ovulation.  Symptothermal method.  This is avoiding sexual intercourse during ovulation, using a thermometer and ovulation symptoms.  Post-ovulation method. This is timing sexual intercourse after you have ovulated. Regardless of which type or method of contraception you choose, it is important that you use condoms to protect against the transmission of sexually transmitted infections (STIs). Talk with your health care provider about which form of contraception is most appropriate for you. This information is not intended to replace advice given to you by your health care provider. Make sure you discuss any questions you have with your health care provider. Document Released: 01/27/2005 Document Revised: 07/05/2015 Document Reviewed: 07/22/2012 Elsevier Interactive Patient Education  2017 [employer].  

## 2016-07-31 NOTE — Progress Notes (Signed)
   PRENATAL VISIT NOTE  Subjective:  Stephanie Sanford is a 21 y.o. G4P0030 at 4675w6d being seen today for ongoing prenatal care.  She is currently monitored for the following issues for this low-risk pregnancy and has Hepatitis B immune; Supervision of normal pregnancy, antepartum; LGSIL on Pap smear of cervix; Sickle cell trait (HCC); Breast mass, right; Oral herpes; and Anemia affecting pregnancy in third trimester on her problem list.  Patient reports no complaints.  Contractions: Not present. Vag. Bleeding: None.  Movement: Present. Denies leaking of fluid.   The following portions of the patient's history were reviewed and updated as appropriate: allergies, current medications, past family history, past medical history, past social history, past surgical history and problem list. Problem list updated.  Objective:   Vitals:   07/31/16 0816  BP: 112/70  Pulse: 94  Weight: 200 lb (90.7 kg)    Fetal Status: Fetal Heart Rate (bpm): 130   Movement: Present     General:  Alert, oriented and cooperative. Patient is in no acute distress.  Skin: Skin is warm and dry. No rash noted.   Cardiovascular: Normal heart rate noted  Respiratory: Normal respiratory effort, no problems with respiration noted  Abdomen: Soft, gravid, appropriate for gestational age. Pain/Pressure: Absent     Pelvic:  Cervical exam deferred        Extremities: Normal range of motion.  Edema: Trace  Mental Status: Normal mood and affect. Normal behavior. Normal judgment and thought content.   Assessment and Plan:  Pregnancy: G4P0030 at 3575w6d  1. Supervision of normal first pregnancy, antepartum Patient is doing well without complaints She remains undecided on contraception Follow up anatomy ultrasound ordered - US MFM OB FOLLOW UP; Future  2. LGSIL on Pap smear of cervix colpo pp  Preterm labor symptoms and general obstetric precautions including but not limited to vaginal bleeding, contractions, leaking of fluid and  fetal movement were reviewed in detail with the patient. Please refer to After Visit Summary for other counseling recommendations.  Return in about 2 weeks (around 08/14/2016) for ROB.   Catalina AntiguaPeggy Fabricio Endsley, MD

## 2016-08-18 ENCOUNTER — Ambulatory Visit (INDEPENDENT_AMBULATORY_CARE_PROVIDER_SITE_OTHER): Payer: Medicaid Other | Admitting: Obstetrics and Gynecology

## 2016-08-18 ENCOUNTER — Other Ambulatory Visit (HOSPITAL_COMMUNITY)
Admission: RE | Admit: 2016-08-18 | Discharge: 2016-08-18 | Disposition: A | Discharge status: Home / Self Care | Payer: Medicaid Other | Source: Ambulatory Visit | Attending: Obstetrics and Gynecology | Admitting: Obstetrics and Gynecology

## 2016-08-18 VITALS — BP 127/80 | HR 84 | Wt 203.8 lb

## 2016-08-18 DIAGNOSIS — Z3480 Encounter for supervision of other normal pregnancy, unspecified trimester: Secondary | ICD-10-CM | POA: Insufficient documentation

## 2016-08-18 DIAGNOSIS — Z3483 Encounter for supervision of other normal pregnancy, third trimester: Secondary | ICD-10-CM

## 2016-08-18 LAB — OB RESULTS CONSOLE GC/CHLAMYDIA: GC PROBE AMP, GENITAL: NEGATIVE

## 2016-08-18 LAB — OB RESULTS CONSOLE GBS: GBS: NEGATIVE

## 2016-08-18 NOTE — Progress Notes (Signed)
Subjective:  Stephanie Sanford is a 21 y.o. G4P0030 at 6338w3d being seen today for ongoing prenatal care.  She is currently monitored for the following issues for this low-risk pregnancy and has Hepatitis B immune; Supervision of normal pregnancy, antepartum; LGSIL on Pap smear of cervix; Sickle cell trait (HCC); Breast mass, right; Oral herpes; and Anemia affecting pregnancy in third trimester on her problem list.  Patient reports no complaints.  Contractions: Not present. Vag. Bleeding: None.  Movement: Present. Denies leaking of fluid.   The following portions of the patient's history were reviewed and updated as appropriate: allergies, current medications, past family history, past medical history, past social history, past surgical history and problem list. Problem list updated.  Objective:   Vitals:   08/18/16 1030  BP: 127/80  Pulse: 84  Weight: 203 lb 12.8 oz (92.4 kg)    Fetal Status: Fetal Heart Rate (bpm): 144   Movement: Present     General:  Alert, oriented and cooperative. Patient is in no acute distress.  Skin: Skin is warm and dry. No rash noted.   Cardiovascular: Normal heart rate noted  Respiratory: Normal respiratory effort, no problems with respiration noted  Abdomen: Soft, gravid, appropriate for gestational age. Pain/Pressure: Present     Pelvic:  Cervical exam performed        Extremities: Normal range of motion.  Edema: Trace  Mental Status: Normal mood and affect. Normal behavior. Normal judgment and thought content.   Urinalysis:      Assessment and Plan:  Pregnancy: G4P0030 at 4038w3d  1. Supervision of other normal pregnancy, antepartum Stable GBS and cultures today Labor precautions  Term labor symptoms and general obstetric precautions including but not limited to vaginal bleeding, contractions, leaking of fluid and fetal movement were reviewed in detail with the patient. Please refer to After Visit Summary for other counseling recommendations.  Return in  about 1 week (around 08/25/2016) for OB visit.   Hermina StaggersErvin, Linsay Vogt L, MD

## 2016-08-18 NOTE — Patient Instructions (Signed)
Vaginal Delivery Vaginal delivery means that you will give birth by pushing your baby out of your birth canal (vagina). A team of health care providers will help you before, during, and after vaginal delivery. Birth experiences are unique for every woman and every pregnancy, and birth experiences vary depending on where you choose to give birth. What should I do to prepare for my baby's birth? Before your baby is born, it is important to talk with your health care provider about:  Your labor and delivery preferences. These may include: ? Medicines that you may be given. ? How you will manage your pain. This might include non-medical pain relief techniques or injectable pain relief such as epidural analgesia. ? How you and your baby will be monitored during labor and delivery. ? Who may be in the labor and delivery room with you. ? Your feelings about surgical delivery of your baby (cesarean delivery, or C-section) if this becomes necessary. ? Your feelings about receiving donated blood through an IV tube (blood transfusion) if this becomes necessary.  Whether you are able: ? To take pictures or videos of the birth. ? To eat during labor and delivery. ? To move around, walk, or change positions during labor and delivery.  What to expect after your baby is born, such as: ? Whether delayed umbilical cord clamping and cutting is offered. ? Who will care for your baby right after birth. ? Medicines or tests that may be recommended for your baby. ? Whether breastfeeding is supported in your hospital or birth center. ? How long you will be in the hospital or birth center.  How any medical conditions you have may affect your baby or your labor and delivery experience.  To prepare for your baby's birth, you should also:  Attend all of your health care visits before delivery (prenatal visits) as recommended by your health care provider. This is important.  Prepare your home for your baby's  arrival. Make sure that you have: ? Diapers. ? Baby clothing. ? Feeding equipment. ? Safe sleeping arrangements for you and your baby.  Install a car seat in your vehicle. Have your car seat checked by a certified car seat installer to make sure that it is installed safely.  Think about who will help you with your new baby at home for at least the first several weeks after delivery.  What can I expect when I arrive at the birth center or hospital? Once you are in labor and have been admitted into the hospital or birth center, your health care provider may:  Review your pregnancy history and any concerns you have.  Insert an IV tube into one of your veins. This is used to give you fluids and medicines.  Check your blood pressure, pulse, temperature, and heart rate (vital signs).  Check whether your bag of water (amniotic sac) has broken (ruptured).  Talk with you about your birth plan and discuss pain control options.  Monitoring Your health care provider may monitor your contractions (uterine monitoring) and your baby's heart rate (fetal monitoring). You may need to be monitored:  Often, but not continuously (intermittently).  All the time or for long periods at a time (continuously). Continuous monitoring may be needed if: ? You are taking certain medicines, such as medicine to relieve pain or make your contractions stronger. ? You have pregnancy or labor complications.  Monitoring may be done by:  Placing a special stethoscope or a handheld monitoring device on your abdomen to   check your baby's heartbeat, and feeling your abdomen for contractions. This method of monitoring does not continuously record your baby's heartbeat or your contractions.  Placing monitors on your abdomen (external monitors) to record your baby's heartbeat and the frequency and length of contractions. You may not have to wear external monitors all the time.  Placing monitors inside of your uterus  (internal monitors) to record your baby's heartbeat and the frequency, length, and strength of your contractions. ? Your health care provider may use internal monitors if he or she needs more information about the strength of your contractions or your baby's heart rate. ? Internal monitors are put in place by passing a thin, flexible wire through your vagina and into your uterus. Depending on the type of monitor, it may remain in your uterus or on your baby's head until birth. ? Your health care provider will discuss the benefits and risks of internal monitoring with you and will ask for your permission before inserting the monitors.  Telemetry. This is a type of continuous monitoring that can be done with external or internal monitors. Instead of having to stay in bed, you are able to move around during telemetry. Ask your health care provider if telemetry is an option for you.  Physical exam Your health care provider may perform a physical exam. This may include:  Checking whether your baby is positioned: ? With the head toward your vagina (head-down). This is most common. ? With the head toward the top of your uterus (head-up or breech). If your baby is in a breech position, your health care provider may try to turn your baby to a head-down position so you can deliver vaginally. If it does not seem that your baby can be born vaginally, your provider may recommend surgery to deliver your baby. In rare cases, you may be able to deliver vaginally if your baby is head-up (breech delivery). ? Lying sideways (transverse). Babies that are lying sideways cannot be delivered vaginally.  Checking your cervix to determine: ? Whether it is thinning out (effacing). ? Whether it is opening up (dilating). ? How low your baby has moved into your birth canal.  What are the three stages of labor and delivery?  Normal labor and delivery is divided into the following three stages: Stage 1  Stage 1 is the  longest stage of labor, and it can last for hours or days. Stage 1 includes: ? Early labor. This is when contractions may be irregular, or regular and mild. Generally, early labor contractions are more than 10 minutes apart. ? Active labor. This is when contractions get longer, more regular, more frequent, and more intense. ? The transition phase. This is when contractions happen very close together, are very intense, and may last longer than during any other part of labor.  Contractions generally feel mild, infrequent, and irregular at first. They get stronger, more frequent (about every 2-3 minutes), and more regular as you progress from early labor through active labor and transition.  Many women progress through stage 1 naturally, but you may need help to continue making progress. If this happens, your health care provider may talk with you about: ? Rupturing your amniotic sac if it has not ruptured yet. ? Giving you medicine to help make your contractions stronger and more frequent.  Stage 1 ends when your cervix is completely dilated to 4 inches (10 cm) and completely effaced. This happens at the end of the transition phase. Stage 2  Once   your cervix is completely effaced and dilated to 4 inches (10 cm), you may start to feel an urge to push. It is common for the body to naturally take a rest before feeling the urge to push, especially if you received an epidural or certain other pain medicines. This rest period may last for up to 1-2 hours, depending on your unique labor experience.  During stage 2, contractions are generally less painful, because pushing helps relieve contraction pain. Instead of contraction pain, you may feel stretching and burning pain, especially when the widest part of your baby's head passes through the vaginal opening (crowning).  Your health care provider will closely monitor your pushing progress and your baby's progress through the vagina during stage 2.  Your  health care provider may massage the area of skin between your vaginal opening and anus (perineum) or apply warm compresses to your perineum. This helps it stretch as the baby's head starts to crown, which can help prevent perineal tearing. ? In some cases, an incision may be made in your perineum (episiotomy) to allow the baby to pass through the vaginal opening. An episiotomy helps to make the opening of the vagina larger to allow more room for the baby to fit through.  It is very important to breathe and focus so your health care provider can control the delivery of your baby's head. Your health care provider may have you decrease the intensity of your pushing, to help prevent perineal tearing.  After delivery of your baby's head, the shoulders and the rest of the body generally deliver very quickly and without difficulty.  Once your baby is delivered, the umbilical cord may be cut right away, or this may be delayed for 1-2 minutes, depending on your baby's health. This may vary among health care providers, hospitals, and birth centers.  If you and your baby are healthy enough, your baby may be placed on your chest or abdomen to help maintain the baby's temperature and to help you bond with each other. Some mothers and babies start breastfeeding at this time. Your health care team will dry your baby and help keep your baby warm during this time.  Your baby may need immediate care if he or she: ? Showed signs of distress during labor. ? Has a medical condition. ? Was born too early (prematurely). ? Had a bowel movement before birth (meconium). ? Shows signs of difficulty transitioning from being inside the uterus to being outside of the uterus. If you are planning to breastfeed, your health care team will help you begin a feeding. Stage 3  The third stage of labor starts immediately after the birth of your baby and ends after you deliver the placenta. The placenta is an organ that develops  during pregnancy to provide oxygen and nutrients to your baby in the womb.  Delivering the placenta may require some pushing, and you may have mild contractions. Breastfeeding can stimulate contractions to help you deliver the placenta.  After the placenta is delivered, your uterus should tighten (contract) and become firm. This helps to stop bleeding in your uterus. To help your uterus contract and to control bleeding, your health care provider may: ? Give you medicine by injection, through an IV tube, by mouth, or through your rectum (rectally). ? Massage your abdomen or perform a vaginal exam to remove any blood clots that are left in your uterus. ? Empty your bladder by placing a thin, flexible tube (catheter) into your bladder. ? Encourage   you to breastfeed your baby. After labor is over, you and your baby will be monitored closely to ensure that you are both healthy until you are ready to go home. Your health care team will teach you how to care for yourself and your baby. This information is not intended to replace advice given to you by your health care provider. Make sure you discuss any questions you have with your health care provider. Document Released: 11/06/2007 Document Revised: 08/17/2015 Document Reviewed: 02/11/2015 Elsevier Interactive Patient Education  2018 [employer]  

## 2016-08-19 LAB — CERVICOVAGINAL ANCILLARY ONLY
Bacterial vaginitis: NEGATIVE
Candida vaginitis: NEGATIVE
Chlamydia: NEGATIVE
NEISSERIA GONORRHEA: NEGATIVE
Trichomonas: NEGATIVE

## 2016-08-20 LAB — STREP GP B NAA: Strep Gp B NAA: NEGATIVE

## 2016-08-21 ENCOUNTER — Ambulatory Visit (HOSPITAL_COMMUNITY)
Admission: RE | Admit: 2016-08-21 | Discharge: 2016-08-21 | Disposition: A | Discharge status: Home / Self Care | Payer: Medicaid Other | Source: Ambulatory Visit | Attending: Obstetrics and Gynecology | Admitting: Obstetrics and Gynecology

## 2016-08-21 DIAGNOSIS — Z3403 Encounter for supervision of normal first pregnancy, third trimester: Secondary | ICD-10-CM | POA: Insufficient documentation

## 2016-08-21 DIAGNOSIS — Z362 Encounter for other antenatal screening follow-up: Secondary | ICD-10-CM | POA: Insufficient documentation

## 2016-08-21 DIAGNOSIS — Z3400 Encounter for supervision of normal first pregnancy, unspecified trimester: Secondary | ICD-10-CM | POA: Diagnosis present

## 2016-08-27 ENCOUNTER — Ambulatory Visit (INDEPENDENT_AMBULATORY_CARE_PROVIDER_SITE_OTHER): Payer: Medicaid Other | Admitting: Certified Nurse Midwife

## 2016-08-27 VITALS — BP 131/83 | HR 87 | Wt 205.0 lb

## 2016-08-27 DIAGNOSIS — D573 Sickle-cell trait: Secondary | ICD-10-CM

## 2016-08-27 DIAGNOSIS — Z3483 Encounter for supervision of other normal pregnancy, third trimester: Secondary | ICD-10-CM

## 2016-08-27 DIAGNOSIS — Z3480 Encounter for supervision of other normal pregnancy, unspecified trimester: Secondary | ICD-10-CM

## 2016-08-27 DIAGNOSIS — O99013 Anemia complicating pregnancy, third trimester: Secondary | ICD-10-CM

## 2016-08-27 DIAGNOSIS — R87612 Low grade squamous intraepithelial lesion on cytologic smear of cervix (LGSIL): Secondary | ICD-10-CM

## 2016-08-27 DIAGNOSIS — N6310 Unspecified lump in the right breast, unspecified quadrant: Secondary | ICD-10-CM

## 2016-08-27 MED ORDER — CITRANATAL BLOOM 90-1 MG PO TABS: 1.0000 | ORAL_TABLET | Freq: Every day | ORAL | 12 refills | Status: DC

## 2016-08-27 NOTE — Progress Notes (Signed)
   PRENATAL VISIT NOTE  Subjective:  Stephanie Sanford is a 21 y.o. G4P0030 at 335w5d being seen today for ongoing prenatal care.  She is currently monitored for the following issues for this low-risk pregnancy and has Hepatitis B immune; Supervision of normal pregnancy, antepartum; LGSIL on Pap smear of cervix; Sickle cell trait (HCC); Breast mass, right; Oral herpes; and Anemia affecting pregnancy in third trimester on her problem list.  Patient reports no complaints.  Contractions: Irregular.  .  Movement: Present. Denies leaking of fluid.   The following portions of the patient's history were reviewed and updated as appropriate: allergies, current medications, past family history, past medical history, past social history, past surgical history and problem list. Problem list updated.  Objective:   Vitals:   08/27/16 0830  BP: 131/83  Pulse: 87  Weight: 205 lb (93 kg)    Fetal Status: Fetal Heart Rate (bpm): 152 Fundal Height: 39 cm Movement: Present  Presentation: Vertex  General:  Alert, oriented and cooperative. Patient is in no acute distress.  Skin: Skin is warm and dry. No rash noted.   Cardiovascular: Normal heart rate noted  Respiratory: Normal respiratory effort, no problems with respiration noted  Abdomen: Soft, gravid, appropriate for gestational age.  Pain/Pressure: Present     Pelvic: Cervical exam performed Dilation: Fingertip Effacement (%): 0 Station: -3  Extremities: Normal range of motion.  Edema: Trace  Mental Status:  Normal mood and affect. Normal behavior. Normal judgment and thought content.   Assessment and Plan:  Pregnancy: G4P0030 at 305w5d  1. Supervision of other normal pregnancy, antepartum     Doing well  2. LGSIL on Pap smear of cervix     Colpo postpartum  3. Sickle cell trait (HCC)        4. Breast mass, right      Has f/u scheduled  5. Anemia affecting pregnancy in third trimester      - Prenatal-DSS-FeCb-FeGl-FA (CITRANATAL BLOOM) 90-1 MG  TABS; Take 1 tablet by mouth daily.  Dispense: 30 tablet; Refill: 12  Term labor symptoms and general obstetric precautions including but not limited to vaginal bleeding, contractions, leaking of fluid and fetal movement were reviewed in detail with the patient. Please refer to After Visit Summary for other counseling recommendations.  Return in about 1 week (around 09/03/2016) for ROB.   Roe Coombsachelle A Savvy Peeters, CNM

## 2016-08-27 NOTE — Progress Notes (Signed)
Patient reports good fetal movement and contractions that come and go. 

## 2016-09-01 ENCOUNTER — Inpatient Hospital Stay (HOSPITAL_COMMUNITY)
Admission: AD | Admit: 2016-09-01 | Discharge: 2016-09-01 | Disposition: A | Discharge status: Home / Self Care | Payer: Medicaid Other | Source: Ambulatory Visit | Attending: Family Medicine | Admitting: Family Medicine

## 2016-09-01 ENCOUNTER — Encounter (HOSPITAL_COMMUNITY): Payer: Self-pay | Admitting: *Deleted

## 2016-09-01 DIAGNOSIS — O471 False labor at or after 37 completed weeks of gestation: Secondary | ICD-10-CM

## 2016-09-01 DIAGNOSIS — Z3480 Encounter for supervision of other normal pregnancy, unspecified trimester: Secondary | ICD-10-CM

## 2016-09-01 NOTE — MAU Note (Signed)
Contractions started 2 days ago, became more intense during the night. Little bit of bleeding and leaking.

## 2016-09-01 NOTE — MAU Note (Signed)
I have communicated with Dr. Shawnie PonsPratt and reviewed vital signs: There were no vitals filed for this visit.  Vaginal exam:  Dilation: 1 Effacement (%): 90 Station: -1 Presentation: Vertex Exam by:: K.Myria Steenbergen,RN,   Also reviewed contraction pattern and that non-stress test is reactive.  It has been documented that patient is contracting every 7-10 minutes with no cervical change over 2 hours not indicating active labor.  Patient denies any other complaints.  Based on this report provider has given order for discharge.  A discharge order and diagnosis entered by a provider.   Labor discharge instructions reviewed with patient.

## 2016-09-02 ENCOUNTER — Inpatient Hospital Stay (HOSPITAL_COMMUNITY): Payer: Medicaid Other | Admitting: Anesthesiology

## 2016-09-02 ENCOUNTER — Inpatient Hospital Stay (HOSPITAL_COMMUNITY)
Admission: AD | Admit: 2016-09-02 | Discharge: 2016-09-04 | DRG: 775 | Disposition: A | Discharge status: Home / Self Care | Payer: Medicaid Other | Source: Ambulatory Visit | Attending: Family Medicine | Admitting: Family Medicine

## 2016-09-02 ENCOUNTER — Encounter (HOSPITAL_COMMUNITY): Payer: Self-pay | Admitting: *Deleted

## 2016-09-02 DIAGNOSIS — O26893 Other specified pregnancy related conditions, third trimester: Secondary | ICD-10-CM | POA: Diagnosis present

## 2016-09-02 DIAGNOSIS — Z3A39 39 weeks gestation of pregnancy: Secondary | ICD-10-CM

## 2016-09-02 DIAGNOSIS — O9902 Anemia complicating childbirth: Principal | ICD-10-CM | POA: Diagnosis present

## 2016-09-02 DIAGNOSIS — Z3480 Encounter for supervision of other normal pregnancy, unspecified trimester: Secondary | ICD-10-CM

## 2016-09-02 DIAGNOSIS — D573 Sickle-cell trait: Secondary | ICD-10-CM | POA: Diagnosis present

## 2016-09-02 LAB — TYPE AND SCREEN
ABO/RH(D): AB POS
ANTIBODY SCREEN: NEGATIVE

## 2016-09-02 LAB — CBC
HCT: 30.4 % — ABNORMAL LOW (ref 36.0–46.0)
HEMOGLOBIN: 9.8 g/dL — AB (ref 12.0–15.0)
MCH: 24.4 pg — AB (ref 26.0–34.0)
MCHC: 32.2 g/dL (ref 30.0–36.0)
MCV: 75.8 fL — ABNORMAL LOW (ref 78.0–100.0)
Platelets: 430 10*3/uL — ABNORMAL HIGH (ref 150–400)
RBC: 4.01 MIL/uL (ref 3.87–5.11)
RDW: 14.6 % (ref 11.5–15.5)
WBC: 12.2 10*3/uL — ABNORMAL HIGH (ref 4.0–10.5)

## 2016-09-02 LAB — ABO/RH: ABO/RH(D): AB POS

## 2016-09-02 MED ORDER — ONDANSETRON HCL 4 MG/2ML IJ SOLN: 4.0000 mg | INTRAMUSCULAR | Status: DC | PRN

## 2016-09-02 MED ORDER — EPHEDRINE 5 MG/ML INJ
10.0000 mg | INTRAVENOUS | Status: DC | PRN
  Filled 2016-09-02: qty 2

## 2016-09-02 MED ORDER — OXYCODONE-ACETAMINOPHEN 5-325 MG PO TABS: 1.0000 | ORAL_TABLET | ORAL | Status: DC | PRN

## 2016-09-02 MED ORDER — DIPHENHYDRAMINE HCL 25 MG PO CAPS: 25.0000 mg | ORAL_CAPSULE | Freq: Four times a day (QID) | ORAL | Status: DC | PRN

## 2016-09-02 MED ORDER — ACETAMINOPHEN 325 MG PO TABS: 650.0000 mg | ORAL_TABLET | ORAL | Status: DC | PRN

## 2016-09-02 MED ORDER — LACTATED RINGERS IV SOLN
INTRAVENOUS | Status: DC
  Administered 2016-09-02 (×2): via INTRAVENOUS

## 2016-09-02 MED ORDER — ONDANSETRON HCL 4 MG PO TABS: 4.0000 mg | ORAL_TABLET | ORAL | Status: DC | PRN

## 2016-09-02 MED ORDER — TETANUS-DIPHTH-ACELL PERTUSSIS 5-2.5-18.5 LF-MCG/0.5 IM SUSP: 0.5000 mL | Freq: Once | INTRAMUSCULAR | Status: DC

## 2016-09-02 MED ORDER — FENTANYL CITRATE (PF) 100 MCG/2ML IJ SOLN
100.0000 ug | INTRAMUSCULAR | Status: DC | PRN
  Administered 2016-09-02 (×2): 100 ug via INTRAVENOUS
  Filled 2016-09-02: qty 2

## 2016-09-02 MED ORDER — FENTANYL 2.5 MCG/ML BUPIVACAINE 1/10 % EPIDURAL INFUSION (WH - ANES)
14.0000 mL/h | INTRAMUSCULAR | Status: DC | PRN
  Administered 2016-09-02 (×2): 14 mL/h via EPIDURAL
  Filled 2016-09-02 (×2): qty 100

## 2016-09-02 MED ORDER — PHENYLEPHRINE 40 MCG/ML (10ML) SYRINGE FOR IV PUSH (FOR BLOOD PRESSURE SUPPORT)
80.0000 ug | PREFILLED_SYRINGE | INTRAVENOUS | Status: DC | PRN
  Filled 2016-09-02: qty 5

## 2016-09-02 MED ORDER — TERBUTALINE SULFATE 1 MG/ML IJ SOLN
0.2500 mg | Freq: Once | INTRAMUSCULAR | Status: DC | PRN
  Filled 2016-09-02: qty 1

## 2016-09-02 MED ORDER — FLEET ENEMA 7-19 GM/118ML RE ENEM: 1.0000 | ENEMA | RECTAL | Status: DC | PRN

## 2016-09-02 MED ORDER — LIDOCAINE HCL (PF) 1 % IJ SOLN
30.0000 mL | INTRAMUSCULAR | Status: DC | PRN
  Filled 2016-09-02: qty 30

## 2016-09-02 MED ORDER — ZOLPIDEM TARTRATE 5 MG PO TABS: 5.0000 mg | ORAL_TABLET | Freq: Every evening | ORAL | Status: DC | PRN

## 2016-09-02 MED ORDER — OXYTOCIN 40 UNITS IN LACTATED RINGERS INFUSION - SIMPLE MED
2.5000 [IU]/h | INTRAVENOUS | Status: DC
  Filled 2016-09-02: qty 1000

## 2016-09-02 MED ORDER — LACTATED RINGERS IV SOLN: 500.0000 mL | INTRAVENOUS | Status: DC | PRN

## 2016-09-02 MED ORDER — IBUPROFEN 600 MG PO TABS
600.0000 mg | ORAL_TABLET | Freq: Four times a day (QID) | ORAL | Status: DC
  Administered 2016-09-02 – 2016-09-04 (×7): 600 mg via ORAL
  Filled 2016-09-02 (×7): qty 1

## 2016-09-02 MED ORDER — PRENATAL MULTIVITAMIN CH
1.0000 | ORAL_TABLET | Freq: Every day | ORAL | Status: DC
  Administered 2016-09-03: 1 via ORAL
  Filled 2016-09-02: qty 1

## 2016-09-02 MED ORDER — PHENYLEPHRINE 40 MCG/ML (10ML) SYRINGE FOR IV PUSH (FOR BLOOD PRESSURE SUPPORT)
80.0000 ug | PREFILLED_SYRINGE | INTRAVENOUS | Status: DC | PRN
  Filled 2016-09-02: qty 10
  Filled 2016-09-02: qty 5

## 2016-09-02 MED ORDER — DIPHENHYDRAMINE HCL 50 MG/ML IJ SOLN: 12.5000 mg | INTRAMUSCULAR | Status: DC | PRN

## 2016-09-02 MED ORDER — SENNOSIDES-DOCUSATE SODIUM 8.6-50 MG PO TABS
2.0000 | ORAL_TABLET | ORAL | Status: DC
  Administered 2016-09-03 – 2016-09-04 (×2): 2 via ORAL
  Filled 2016-09-02 (×2): qty 2

## 2016-09-02 MED ORDER — BENZOCAINE-MENTHOL 20-0.5 % EX AERO
1.0000 "application " | INHALATION_SPRAY | CUTANEOUS | Status: DC | PRN
  Administered 2016-09-02: 1 via TOPICAL
  Filled 2016-09-02: qty 56

## 2016-09-02 MED ORDER — WITCH HAZEL-GLYCERIN EX PADS: 1.0000 "application " | MEDICATED_PAD | CUTANEOUS | Status: DC | PRN

## 2016-09-02 MED ORDER — OXYCODONE-ACETAMINOPHEN 5-325 MG PO TABS: 2.0000 | ORAL_TABLET | ORAL | Status: DC | PRN

## 2016-09-02 MED ORDER — DIBUCAINE 1 % RE OINT: 1.0000 "application " | TOPICAL_OINTMENT | RECTAL | Status: DC | PRN

## 2016-09-02 MED ORDER — ONDANSETRON HCL 4 MG/2ML IJ SOLN
4.0000 mg | Freq: Four times a day (QID) | INTRAMUSCULAR | Status: DC | PRN
Start: 2016-09-02 — End: 2016-09-02

## 2016-09-02 MED ORDER — COCONUT OIL OIL
1.0000 | TOPICAL_OIL | Status: DC | PRN
Start: 2016-09-02 — End: 2016-09-04

## 2016-09-02 MED ORDER — OXYTOCIN BOLUS FROM INFUSION
500.0000 mL | Freq: Once | INTRAVENOUS | Status: AC
  Administered 2016-09-02: 500 mL via INTRAVENOUS

## 2016-09-02 MED ORDER — LACTATED RINGERS IV SOLN: 500.0000 mL | Freq: Once | INTRAVENOUS | Status: DC

## 2016-09-02 MED ORDER — SOD CITRATE-CITRIC ACID 500-334 MG/5ML PO SOLN: 30.0000 mL | ORAL | Status: DC | PRN

## 2016-09-02 MED ORDER — FENTANYL CITRATE (PF) 100 MCG/2ML IJ SOLN
INTRAMUSCULAR | Status: AC
  Administered 2016-09-02: 100 ug via INTRAVENOUS
  Filled 2016-09-02: qty 2

## 2016-09-02 MED ORDER — LIDOCAINE HCL (PF) 1 % IJ SOLN
INTRAMUSCULAR | Status: DC | PRN
  Administered 2016-09-02: 4 mL via EPIDURAL
  Administered 2016-09-02: 7 mL via EPIDURAL

## 2016-09-02 MED ORDER — OXYTOCIN 40 UNITS IN LACTATED RINGERS INFUSION - SIMPLE MED
1.0000 m[IU]/min | INTRAVENOUS | Status: DC
  Administered 2016-09-02: 2 m[IU]/min via INTRAVENOUS

## 2016-09-02 MED ORDER — SIMETHICONE 80 MG PO CHEW: 80.0000 mg | CHEWABLE_TABLET | ORAL | Status: DC | PRN

## 2016-09-02 MED ORDER — ERYTHROMYCIN 5 MG/GM OP OINT
TOPICAL_OINTMENT | OPHTHALMIC | Status: AC
  Filled 2016-09-02: qty 1

## 2016-09-02 NOTE — Anesthesia Preprocedure Evaluation (Signed)
Anesthesia Evaluation  Patient identified by MRN, date of birth, ID band Patient awake    Reviewed: Allergy & Precautions, H&P , NPO status , Patient's Chart, lab work & pertinent test results  Airway Mallampati: II  TM Distance: >3 FB Neck ROM: full    Dental no notable dental hx. (+) Teeth Intact   Pulmonary neg pulmonary ROS,    Pulmonary exam normal breath sounds clear to auscultation       Cardiovascular negative cardio ROS Normal cardiovascular exam Rhythm:regular Rate:Normal     Neuro/Psych negative neurological ROS  negative psych ROS   GI/Hepatic negative GI ROS, Neg liver ROS,   Endo/Other  negative endocrine ROS  Renal/GU negative Renal ROS     Musculoskeletal   Abdominal (+) + obese,   Peds  Hematology  (+) Blood dyscrasia, anemia ,   Anesthesia Other Findings   Reproductive/Obstetrics (+) Pregnancy                             Anesthesia Physical Anesthesia Plan  ASA: II  Anesthesia Plan: Epidural   Post-op Pain Management:    Induction:   PONV Risk Score and Plan:   Airway Management Planned:   Additional Equipment:   Intra-op Plan:   Post-operative Plan:   Informed Consent: I have reviewed the patients History and Physical, chart, labs and discussed the procedure including the risks, benefits and alternatives for the proposed anesthesia with the patient or authorized representative who has indicated his/her understanding and acceptance.       Plan Discussed with:   Anesthesia Plan Comments:         Anesthesia Quick Evaluation  

## 2016-09-02 NOTE — Progress Notes (Signed)
Patient ID: [name], female   DOB: Sep 30, 1995, 21 y.o.   MRN: [id number] Labor Progress Note  S: Patient seen & examined for progress of labor. Patient comfortable with epidural.   O: BP 124/74   Pulse 92   Temp 97.6 F (36.4 C) (Axillary)   Resp 18   Ht 5\' 5"  (1.651 m)   Wt 93 kg (205 lb)   LMP 11/30/2015   SpO2 100%   BMI 34.11 kg/m   FHT: 135bpm, mod var, +accels, variable decels- better with position change TOCO: irregular, patient looks comfortable during contractions  CVE: Dilation: 5.5 Effacement (%): 80 Station: -1 Presentation: Vertex Exam by:: [name], RN   A&P: 21 y.o. G4P0030 3711w4d SROM @0445 - clear  Currently on 2 milli-units of pitocin Continue current management Anticipate SVD  [place] Keilly Fatula, DO FM Resident PGY-1 09/02/2016 9:13 AM

## 2016-09-02 NOTE — H&P (Signed)
Obstetric History and Physical  Stephanie Sanford is a 21 y.o. G4P0030 with IUP at [redacted]w[redacted]d presenting for SOL at 0900 yesterday with ROM at 0445 with clear fluid . Patient states she has been having  regular, every 5 minutes contractions, minimal vaginal bleeding, intact, ruptured membranes, with active fetal movement.   She has a PMH notable for  Past Medical History:  Diagnosis Date  . Mouth ulcers     Prenatal Course Source of Care: [facility]   with onset of care at 8 weeks Pregnancy complications or risks: Patient Active Problem List   Diagnosis Date Noted  . Indication for care in labor and delivery, antepartum 09/02/2016  . Anemia affecting pregnancy in third trimester 07/14/2016  . Supervision of normal pregnancy, antepartum 12-Jul-2016  . LGSIL on Pap smear of cervix 07-12-2016  . Sickle cell trait (HCC) 07/12/16  . Breast mass, right 2016/07/12  . Oral herpes 2016/07/12  . Hepatitis B immune 03/29/2015   She plans to breastfeed She desires unsure for postpartum contraception.   Prenatal labs and studies: ABO, Rh: AB/Positive/-- 2022-07-13 1135) Antibody: Negative July 13, 2022 1135) Rubella: 4.51 13-Jul-2022 1135) RPR: Non Reactive July 13, 2022 1135)  HBsAg: Negative 07-13-22 1135)  HIV:  Non-Reactive ZOX:WRUEAVWU (07/09 1103) 1 hr Glucola  wnl Genetic screening not performed  Anatomy US normal  Prenatal Transfer Tool  Maternal Diabetes: No Genetic Screening: Declined Maternal Ultrasounds/Referrals: Normal Fetal Ultrasounds or other Referrals:  None Maternal Substance Abuse:  No Significant Maternal Medications:  None Significant Maternal Lab Results: None  Past Medical History:  Diagnosis Date  . Mouth ulcers     Past Surgical History:  Procedure Laterality Date  . NO PAST SURGERIES      OB History  Gravida Para Term Preterm AB Living  4 0 0 0 3 0  SAB TAB Ectopic Multiple Live Births  2 1     0    # Outcome Date GA Lbr Len/2nd Weight Sex Delivery Anes PTL Lv  4 Current            3 SAB 10/12/15          2 SAB 05/12/15          1 TAB 02/11/11              Social History   Social History  . Marital status: Single    Spouse name: N/A  . Number of children: N/A  . Years of education: N/A   Social History Main Topics  . Smoking status: Never Smoker  . Smokeless tobacco: Never Used  . Alcohol use No  . Drug use: No  . Sexual activity: Yes   Other Topics Concern  . None   Social History Narrative  . None    History reviewed. No pertinent family history.  Prescriptions Prior to Admission  Medication Sig Dispense Refill Last Dose  . ondansetron (ZOFRAN) 8 MG tablet Take 1 tablet (8 mg total) by mouth every 8 (eight) hours as needed for nausea or vomiting. (Patient not taking: Reported on 08/27/2016) 40 tablet 2 Not Taking  . Prenat-FeAsp-Meth-FA-DHA w/o A (PRENATE PIXIE) 10-0.6-0.4-200 MG CAPS Take 1 tablet by mouth daily. (Patient not taking: Reported on 09/01/2016) 30 capsule 12 Not Taking at Unknown time  . Prenatal-DSS-FeCb-FeGl-FA (CITRANATAL BLOOM) 90-1 MG TABS Take 1 tablet by mouth daily. 30 tablet 12 Past Week at Unknown time    Allergies  Allergen Reactions  . Tomato Hives and Swelling    Review of Systems: Negative  except for what is mentioned in HPI.  Physical Exam: BP 133/90 (BP Location: Right Arm)   Pulse 89   Temp 98.8 F (37.1 C) (Oral)   Resp 18   Ht 5\' 5"  (1.651 m)   Wt 93 kg (205 lb)   LMP 11/30/2015   BMI 34.11 kg/m  CONSTITUTIONAL: Well-developed, well-nourished female in no acute distress.  SKIN: Skin is warm and dry. No rash noted. Not diaphoretic. No erythema. No pallor. PSYCHIATRIC: Normal mood and affect. Normal behavior. Normal judgment and thought content. CARDIOVASCULAR: Normal heart rate noted, regular rhythm RESPIRATORY: Effort and breath sounds normal, no problems with respiration noted ABDOMEN: Soft, nontender, nondistended, gravid.  Dilation: 4 Effacement (%): 80 Station: -1 Presentation:  Vertex Exam by:: DCALLAWAY, RN    Pertinent Labs/Studies:   Results for orders placed or performed during the hospital encounter of 09/02/16 (from the past 24 hour(s))  CBC     Status: Abnormal   Collection Time: 09/02/16  5:30 AM  Result Value Ref Range   WBC 12.2 (H) 4.0 - 10.5 K/uL   RBC 4.01 3.87 - 5.11 MIL/uL   Hemoglobin 9.8 (L) 12.0 - 15.0 g/dL   HCT 16.130.4 (L) 09.636.0 - 04.546.0 %   MCV 75.8 (L) 78.0 - 100.0 fL   MCH 24.4 (L) 26.0 - 34.0 pg   MCHC 32.2 30.0 - 36.0 g/dL   RDW 40.914.6 81.111.5 - 91.415.5 %   Platelets 430 (H) 150 - 400 K/uL    Assessment : Stephanie Sanford is a 21 y.o. G4P0030 at 6564w4d being admitted for SOL/SROM  Plan: Labor: Expectant management. Analgesia prn. Pt wants to wait a little longer to get an epidural  FWB: Category I fetal heart tracing.  GBS negative Delivery plan: Hopeful for vaginal delivery   Sullivan LoneBrannon L Inman, Medical Student  I confirm that I have verified the information documented in the medical student's note and that I have also personally reperformed the physical exam and all medical decision making activities. Raelyn MoraRolitta Ashten Prats, CNM 09/02/2016 7:02 AM

## 2016-09-02 NOTE — Anesthesia Postprocedure Evaluation (Signed)
Anesthesia Post Note  Patient: Stephanie HoardDaya Sanford  Procedure(s) Performed: * No procedures listed *     Patient location during evaluation: Mother Baby Anesthesia Type: Epidural Level of consciousness: awake and alert, awake and oriented Pain management: satisfactory to patient Vital Signs Assessment: post-procedure vital signs reviewed and stable Respiratory status: respiratory function stable Cardiovascular status: stable Postop Assessment: no headache, no backache, epidural receding, patient able to bend at knees, no signs of nausea or vomiting and adequate PO intake Anesthetic complications: no    Last Vitals:  Vitals:   09/02/16 1550 09/02/16 1647  BP: 134/80 117/72  Pulse: (!) 102 89  Resp: 18 18  Temp: 37 C 37.1 C    Last Pain:  Vitals:   09/02/16 1729  TempSrc:   PainSc: 0-No pain   Pain Goal:                 Charise Leinbach

## 2016-09-02 NOTE — Anesthesia Pain Management Evaluation Note (Signed)
  CRNA Pain Management Visit Note  Patient: Stephanie Sanford, 21 y.o., female  "Hello I am a [facility] the [facility]. We have an anesthesia team available at all times to provide care throughout the hospital, including epidural management and anesthesia for C-section. I don't know your plan for the delivery whether it a natural birth, water birth, IV sedation, nitrous supplementation, doula or epidural, but we want to meet your pain goals."   1.Was your pain managed to your expectations on prior hospitalizations?   No prior hospitalizations  2.What is your expectation for pain management during this hospitalization?     Epidural  3.How can we help you reach that goal? epidural  Record the patient's initial score and the patient's pain goal.   Pain: 10  Pain Goal: 10 [facility] wants you to be able to say your pain was always managed very well.  Stephanie Sanford 09/02/2016

## 2016-09-02 NOTE — MAU Note (Signed)
PT ARRIVED VIA EMS-    WITH STRONG  UC.  WAS HERE ON Monday 1 CM.   DENIES HSV AND  MRSA.   GBS-  UNSURE

## 2016-09-02 NOTE — Lactation Note (Signed)
This note was copied from a baby's chart. Lactation Consultation Note  Patient Name: Stephanie Sanford WUJWJ'XToday's Date: 09/02/2016 Reason for consult: Initial assessment Infant is 6 hours old & seen by Lactation for Initial assessment. Infant was born at 7930w4d and weighed 7 lbs 4.9 oz at birth. Mom was working on BF on right breast in football hold when [name]. Mom has large breasts with short shafted nipples, tissue is compressible. Mom's milk was flowing every time she compressed her breast when trying to relatch him. Baby came on & off frequently and maintained a latch a few times where mom stated she could feel a strong tug. Showed mom how to shape her breast to help him achieve a deeper latch and help him to maintain it better. Discussed what to look for in a good latch. Also provided mom with some Breast Shells to help bring her nipple out more- discussed use & cleaning; encouraged mom to wear them for ~20 mins before BF or whenever she is awake but never while sleeping.  Mom encouraged to feed baby 8-12 times/24 hours and with feeding cues. Encouraged skin-to-skin & hand expressing. Discussed milk volume and stomach size. Provided mom with BF booklet, BF resources, and feeding log; mom made aware of O/P services, breastfeeding support groups, community resources, and our phone # for post-discharge questions.  [facility] but plans to apply soon. Mom reports no questions at this time. Encouraged mom to ask for help as needed.    Maternal Data Has patient been taught Hand Expression?: Yes  Feeding Feeding Type: Breast Fed Length of feed: 15 min (on & off)  LATCH Score/Interventions Latch: Repeated attempts needed to sustain latch, nipple held in mouth throughout feeding, stimulation needed to elicit sucking reflex. Intervention(s): Assist with latch;Breast compression  Audible Swallowing: A few with stimulation Intervention(s): Skin to skin;Hand  expression  Type of Nipple: Flat (short shaft ) Intervention(s): Shells  Comfort (Breast/Nipple): Soft / non-tender     Hold (Positioning): No assistance needed to correctly position infant at breast. Intervention(s): Breastfeeding basics reviewed;Skin to skin  LATCH Score: 7  Lactation Tools Discussed/Used WIC Program: No (plans to apply)   Consult Status Consult Status: Follow-up Date: 09/03/16 Follow-up type: In-patient    Stephanie GroutLaura C Sayvon Sanford 09/02/2016, 7:31 PM

## 2016-09-02 NOTE — Anesthesia Procedure Notes (Signed)
Epidural Patient location during procedure: OB Start time: 09/02/2016 8:22 AM End time: 09/02/2016 8:26 AM  Staffing Anesthesiologist: Leilani AbleHATCHETT, Lorynn Moeser Performed: anesthesiologist   Preanesthetic Checklist Completed: patient identified, surgical consent, pre-op evaluation, timeout performed, IV checked, risks and benefits discussed and monitors and equipment checked  Epidural Patient position: sitting Prep: site prepped and draped and DuraPrep Patient monitoring: continuous pulse ox and blood pressure Approach: midline Location: L3-L4 Injection technique: LOR air  Needle:  Needle type: Tuohy  Needle gauge: 17 G Needle length: 9 cm and 9 Needle insertion depth: 5 cm cm Catheter type: closed end flexible Catheter size: 19 Gauge Catheter at skin depth: 10 cm Test dose: negative and Other  Assessment Sensory level: T9 Events: blood not aspirated, injection not painful, no injection resistance, negative IV test and no paresthesia  Additional Notes Reason for block:procedure for pain

## 2016-09-02 NOTE — Plan of Care (Signed)
Problem: Education: Goal: Knowledge of condition will improve Outcome: Progressing Admission information and packet given.  How to work the call bell, use the phone, baby safety, safe sleep, and plan of care discussed.  Pt verbalized understanding.

## 2016-09-03 LAB — RPR: RPR: NONREACTIVE

## 2016-09-03 MED ORDER — BREAST MILK
ORAL | Status: DC
  Filled 2016-09-03 (×2): qty 1

## 2016-09-03 NOTE — Progress Notes (Signed)
MOB PNC initiated at 9 weeks at [facility]. MOB was consistent with PNC and transferred to [facility] at 31 weeks.  CSW screened out consult due to adequate PNC.  CSW also notified CN.  Baltazar Pekala Boyd-Gilyard, MSW, LCSW Clinical Social Work [phone]  

## 2016-09-03 NOTE — Plan of Care (Signed)
Problem: Education: Goal: Knowledge of condition will improve Outcome: Progressing Pt decided to pump and bottle feed breastmilk secondary to feeling stressed out.  Educated about pumping, storage & demos DEBP effectively.  Instructed on when to feed infant & how often to pump.  She continues to ask appropriate questions & is encouraged to do so.  Problem: Nutritional: Goal: Mothers verbalization of comfort with breastfeeding process will improve Outcome: Progressing Pt decided to pump & bottle feed breast milk.  Educated on process. See note on Knowledge goal.

## 2016-09-03 NOTE — Progress Notes (Signed)
Post Partum Day 1 Subjective: no complaints, voiding, tolerating PO, + flatus and No pain  Objective: Blood pressure 131/68, pulse 97, temperature 98 F (36.7 C), temperature source Oral, resp. rate 18, height 5\' 5"  (1.651 m), weight 205 lb (93 kg), last menstrual period 11/30/2015, SpO2 100 %, unknown if currently breastfeeding.  Physical Exam:  General: alert, appears stated age, no distress, mildly obese and No abd ttp Lochia: appropriate Uterine Fundus: firm Incision: none DVT Evaluation: No evidence of DVT seen on physical exam. Negative Homan's sign. No cords or calf tenderness. No significant calf/ankle edema.   Recent Labs  09/02/16 0530  HGB 9.8*  HCT 30.4*    Assessment/Plan: Discharge home tomorrow, Attempted breast feeding and prefers bottle feeding, Contraception undecided at this time. Encouraged condom/barrier methods until method decided upon. Patient to keep 4 week f/u appointment. Outpatient circumcision.   LOS: 1 day   Lucendia HerrlichLaura Gardner 09/03/2016, 7:27 AM   CNM attestation Post Partum Day #1 I have seen and examined this patient and agree with above documentation in the PA student's note.   Lissa HoardDaya Sumpter is a 21 y.o. [phone] s/p SVD.  Pt denies problems with ambulating, voiding or po intake. Pain is well controlled.  Plan for birth control is unsure.  Method of Feeding: both  PE:  BP 131/68 (BP Location: Left Arm)   Pulse 97   Temp 98 F (36.7 C) (Oral)   Resp 18   Ht 5\' 5"  (1.651 m)   Wt 93 kg (205 lb)   LMP 11/30/2015   SpO2 100%   Breastfeeding? Unknown   BMI 34.11 kg/m  Fundus firm  Plan for discharge: 09/04/16  Cam HaiSHAW, Nero Sawatzky, CNM 8:10 AM 09/03/2016

## 2016-09-04 ENCOUNTER — Encounter: Payer: Medicaid Other | Admitting: Certified Nurse Midwife

## 2016-09-04 MED ORDER — IBUPROFEN 600 MG PO TABS: 600.0000 mg | ORAL_TABLET | Freq: Four times a day (QID) | ORAL | 0 refills | Status: DC

## 2016-09-04 NOTE — Discharge Summary (Signed)
OB Discharge Summary  Patient Name: Stephanie Sanford DOB: 1995-09-23 MRN: [id number]  Date of admission: 09/02/2016 Delivering MD: Frederik PearEGELE, JULIE P   Date of discharge: 09/04/2016  Admitting diagnosis: 40 WEEKS CTX Intrauterine pregnancy: 3721w4d     Secondary diagnosis:Active Problems:   Indication for care in labor and delivery, antepartum  SOL, SROM     Discharge diagnosis: Term Pregnancy Delivered                  Augmentation: Pitocin  Complications: None  Hospital course:  Onset of Labor With Vaginal Delivery     21 y.o. yo [id number] at 5221w4d was admitted in Active Labor on 09/02/2016. Patient had an uncomplicated labor course as follows:  Membrane Rupture Time/Date: 4:45 AM ,09/02/2016   Intrapartum Procedures: Episiotomy: None [1]                                         Lacerations:  1st degree [2];Labial [10]  Patient had a delivery of a Viable infant. 09/02/2016  Information for the patient's newborn:  Seward Carolucker, Boy Stephanie Sanford [[id number]][[id number]]       Pateint had an uncomplicated postpartum course.  She is ambulating, tolerating a regular diet, passing flatus, and urinating well. Patient is discharged home in stable condition on 09/04/16.   Physical exam  Vitals:   09/02/16 2127 09/03/16 0523 09/03/16 1813 09/04/16 0553  BP: 122/61 131/68 117/75 116/66  Pulse: 97 97 93 95  Resp: 18 18 19 18   Temp: 98.7 F (37.1 C) 98 F (36.7 C) 98.6 F (37 C) 98 F (36.7 C)  TempSrc: Oral Oral Oral Oral  SpO2:   100%   Weight:      Height:       General: alert Lochia: appropriate Uterine Fundus: firm Incision: N/A DVT Evaluation: No evidence of DVT seen on physical exam. Labs: Lab Results  Component Value Date   WBC 12.2 (H) 09/02/2016   HGB 9.8 (L) 09/02/2016   HCT 30.4 (L) 09/02/2016   MCV 75.8 (L) 09/02/2016   PLT 430 (H) 09/02/2016   CMP Latest Ref Rng & Units 04/04/2014  Glucose 70 - 99 mg/dL 88  BUN 6 - 23 mg/dL 7  Creatinine 6.960.50 - 2.951.10 mg/dL 2.840.87  Sodium 132135 - 440145  mmol/L 136  Potassium 3.5 - 5.1 mmol/L 3.7  Chloride 96 - 112 mmol/L 102  CO2 19 - 32 mmol/L 25  Calcium 8.4 - 10.5 mg/dL 9.9    Discharge instruction: per After Visit Summary and "Baby and Me Booklet".  After Visit Meds:  Allergies as of 09/04/2016      Reactions   Tomato Hives, Swelling      Medication List    STOP taking these medications   ondansetron 8 MG tablet Commonly known as:  ZOFRAN     TAKE these medications   CITRANATAL BLOOM 90-1 MG Tabs Take 1 tablet by mouth daily.   ibuprofen 600 MG tablet Commonly known as:  ADVIL,MOTRIN Take 1 tablet (600 mg total) by mouth every 6 (six) hours.   PRENATE PIXIE 10-0.6-0.4-200 MG Caps Take 1 tablet by mouth daily.       Diet: routine diet  Activity: Advance as tolerated. Pelvic rest for 6 weeks.   Outpatient follow up: 4 weeks Follow up Appt:No future appointments. Follow up visit: No Follow-up on file.  Postpartum contraception: condoms  Newborn Data: Live born female  Birth Weight: 7 lb 4.9 oz (3314 g) APGAR: 8, 9  Baby Feeding: Bottle Disposition:home with mother   09/04/2016 Allie BossierMyra C Elfreida Heggs, MD

## 2016-09-04 NOTE — Lactation Note (Signed)
This note was copied from a baby's chart. Lactation Consultation Note: Mother is exclusively pumping. She has been pumping 30-40 ml every 2-3 hours. Mother has a [employer] centre managerpectra electric pump at home. Advised mother to continue to pump every 2-3 hour. Encouraged good breast massage when breast are filling firm and full. Mother to use ice for treatment of engorgement. Mother informed of LC Breastfeeding support group and phone number for any questions or concerns. Mother receptive to all teaching.   Patient Name: Boy Lissa HoardDaya Gelin ZOXWR'UToday's Date: 09/04/2016 Reason for consult: Follow-up assessment   Maternal Data    Feeding    LATCH Score/Interventions                      Lactation Tools Discussed/Used     Consult Status Consult Status: Complete    Michel BickersKendrick, Mariselda Badalamenti McCoy 09/04/2016, 9:02 AM

## 2016-09-04 NOTE — Discharge Instructions (Signed)
Contraception Choices °Contraception, also called birth control, means things to use or ways to try not to get pregnant. °Hormonal birth control °This kind of birth control uses hormones. Here are some types of hormonal birth control: °· A tube that is put under skin of the arm (implant). The tube can stay in for as long as 3 years. °· Shots to get every 3 months (injections). °· Pills to take every day (birth control pills). °· A patch to change 1 time each week for 3 weeks (birth control patch). After that, the patch is taken off for 1 week. °· A ring to put in the vagina. The ring is left in for 3 weeks. Then it is taken out of the vagina for 1 week. Then a new ring is put in. °· Pills to take after unprotected sex (emergency birth control pills). ° °Barrier birth control °Here are some types of barrier birth control: °· A thin covering that is put on the penis before sex (female condom). The covering is thrown away after sex. °· A soft, loose covering that is put in the vagina before sex (female condom). The covering is thrown away after sex. °· A rubber bowl that sits over the cervix (diaphragm). The bowl must be made for you. The bowl is put into the vagina before sex. The bowl is left in for 6-8 hours after sex. It is taken out within 24 hours. °· A small, soft cup that fits over the cervix (cervical cap). The cup must be made for you. The cup can be left in for 6-8 hours after sex. It is taken out within 48 hours. °· A sponge that is put into the vagina before sex. It must be left in for at least 6 hours after sex. It must be taken out within 30 hours. Then it is thrown away. °· A chemical that kills or stops sperm from getting into the uterus (spermicide). It may be a pill, cream, jelly, or foam to put in the vagina. The chemical should be used at least 10-15 minutes before sex. ° °IUD (intrauterine) birth control °An IUD is a small, T-shaped piece of plastic. It is put inside the uterus. There are two  kinds: °· Hormone IUD. This kind can stay in for 3-5 years. °· Copper IUD. This kind can stay in for 10 years. ° °Permanent birth control °Here are some types of permanent birth control: °· Surgery to block the fallopian tubes. °· Having an insert put into each fallopian tube. °· Surgery to tie off the tubes that carry sperm (vasectomy). ° °Natural planning birth control °Here are some types of natural planning birth control: °· Not having sex on the days the woman could get pregnant. °· Using a calendar: °? To keep track of the length of each period. °? To find out what days pregnancy can happen. °? To plan to not have sex on days when pregnancy can happen. °· Watching for symptoms of ovulation and not having sex during ovulation. One way the woman can check for ovulation is to check her temperature. °· Waiting to have sex until after ovulation. ° °Summary °· Contraception, also called birth control, means things to use or ways to try not to get pregnant. °· Hormonal methods of birth control include implants, injections, pills, patches, vaginal rings, and emergency birth control pills. °· Barrier methods of birth control can include female condoms, female condoms, diaphragms, cervical caps, sponges, and spermicides. °· There are two types of   IUD (intrauterine device) birth control. An IUD can be put in a woman's uterus to prevent pregnancy for 3-5 years. °· Permanent sterilization can be done through a procedure for males, females, or both. °· Natural planning methods involve not having sex on the days when the woman could get pregnant. °This information is not intended to replace advice given to you by your health care provider. Make sure you discuss any questions you have with your health care provider. °Document Released: 11/24/2008 Document Revised: 02/07/2016 Document Reviewed: 02/07/2016 °Elsevier Interactive Patient Education © 2017 [employer]vier [employer] °Home Care Instructions for Mom °ACTIVITY °· Gradually return to  your regular activities. °· Let yourself rest. Nap while your baby sleeps. °· Avoid lifting anything that is heavier than 10 lb (4.5 kg) until your health care provider says it is okay. °· Avoid activities that take a lot of effort and energy (are strenuous) until approved by your health care provider. Walking at a slow-to-moderate pace is usually safe. °· If you had a cesarean delivery: °? Do not vacuum, climb stairs, or drive a car for 4-6 weeks. °? Have someone help you at home until you feel like you can do your usual activities yourself. °? Do exercises as told by your health care provider, if this applies. ° °VAGINAL BLEEDING °You may continue to bleed for 4-6 weeks after delivery. Over time, the amount of blood usually decreases and the color of the blood usually gets lighter. However, the flow of bright red blood may increase if you have been too active. If you need to use more than one pad in an hour because your pad gets soaked, or if you pass a large clot: °· Lie down. °· Raise your feet. °· Place a cold compress on your lower abdomen. °· Rest. °· Call your health care provider. ° °If you are breastfeeding, your period should return anytime between 8 weeks after delivery and the time that you stop breastfeeding. If you are not breastfeeding, your period should return 6-8 weeks after delivery. °PERINEAL CARE °The perineal area, or perineum, is the part of your body between your thighs. After delivery, this area needs special care. Follow these instructions as told by your health care provider. °· Take warm tub baths for 15-20 minutes. °· Use medicated pads and pain-relieving sprays and creams as told. °· Do not use tampons or douches until vaginal bleeding has stopped. °· Each time you go to the bathroom: °? Use a peri bottle. °? Change your pad. °? Use towelettes in place of toilet paper until your stitches have healed. °· Do Kegel exercises every day. Kegel exercises help to maintain the muscles that  support the vagina, bladder, and bowels. You can do these exercises while you are standing, sitting, or lying down. To do Kegel exercises: °? Tighten the muscles of your abdomen and the muscles that surround your birth canal. °? Hold for a few seconds. °? Relax. °? Repeat until you have done this 5 times in a row. °· To prevent hemorrhoids from developing or getting worse: °? Drink enough fluid to keep your urine clear or pale yellow. °? Avoid straining when having a bowel movement. °? Take over-the-counter medicines and stool softeners as told by your health care provider. ° °BREAST CARE °· Wear a tight-fitting bra. °· Avoid taking over-the-counter pain medicine for breast discomfort. °· Apply ice to the breasts to help with discomfort as needed: °? Put ice in a plastic bag. °? Place a towel between your   skin and the bag. °? Leave the ice on for 20 minutes or as told by your health care provider. ° °NUTRITION °· Eat a well-balanced diet. °· Do not try to lose weight quickly by cutting back on calories. °· Take your prenatal vitamins until your postpartum checkup or until your health care provider tells you to stop. ° °POSTPARTUM DEPRESSION °You may find yourself crying for no apparent reason and unable to cope with all of the changes that come with having a newborn. This mood is called postpartum depression. Postpartum depression happens because your hormone levels change after delivery. If you have postpartum depression, get support from your partner, friends, and family. If the depression does not go away on its own after several weeks, contact your health care provider. °BREAST SELF-EXAM °Do a breast self-exam each month, at the same time of the month. If you are breastfeeding, check your breasts just after a feeding, when your breasts are less full. If you are breastfeeding and your period has started, check your breasts on day 5, 6, or 7 of your period. °Report any lumps, bumps, or discharge to your health  care provider. Know that breasts are normally lumpy if you are breastfeeding. This is temporary, and it is not a health risk. °INTIMACY AND SEXUALITY °Avoid sexual activity for at least 3-4 weeks after delivery or until the brownish-red vaginal flow is completely gone. If you want to avoid pregnancy, use some form of birth control. You can get pregnant after delivery, even if you have not had your period. °SEEK MEDICAL CARE IF: °· You feel unable to cope with the changes that a child brings to your life, and these feelings do not go away after several weeks. °· You notice a lump, a bump, or discharge on your breast. ° °SEEK IMMEDIATE MEDICAL CARE IF: °· Blood soaks your pad in 1 hour or less. °· You have: °? Severe pain or cramping in your lower abdomen. °? A bad-smelling vaginal discharge. °? A fever that is not controlled by medicine. °? A fever, and an area of your breast is red and sore. °? Pain or redness in your calf. °? Sudden, severe chest pain. °? Shortness of breath. °? Painful or bloody urination. °? Problems with your vision. °· You vomit for 12 hours or longer. °· You develop a severe headache. °· You have serious thoughts about hurting yourself, your child, or anyone else. ° °This information is not intended to replace advice given to you by your health care provider. Make sure you discuss any questions you have with your health care provider. °Document Released: 01/25/2000 Document Revised: 07/05/2015 Document Reviewed: 07/31/2014 °Elsevier Interactive Patient Education © 2017 Elsevier Inc. ° °

## 2016-09-11 ENCOUNTER — Encounter: Payer: Medicaid Other | Admitting: Certified Nurse Midwife

## 2016-09-30 ENCOUNTER — Encounter: Payer: Self-pay | Admitting: Certified Nurse Midwife

## 2016-09-30 ENCOUNTER — Other Ambulatory Visit (HOSPITAL_COMMUNITY)
Admission: RE | Admit: 2016-09-30 | Discharge: 2016-09-30 | Disposition: A | Discharge status: Home / Self Care | Payer: BLUE CROSS/BLUE SHIELD | Source: Ambulatory Visit | Attending: Certified Nurse Midwife | Admitting: Certified Nurse Midwife

## 2016-09-30 ENCOUNTER — Ambulatory Visit (INDEPENDENT_AMBULATORY_CARE_PROVIDER_SITE_OTHER): Payer: BLUE CROSS/BLUE SHIELD | Admitting: Certified Nurse Midwife

## 2016-09-30 VITALS — BP 129/81 | HR 87 | Ht 65.0 in | Wt 186.9 lb

## 2016-09-30 DIAGNOSIS — N898 Other specified noninflammatory disorders of vagina: Secondary | ICD-10-CM | POA: Diagnosis present

## 2016-09-30 DIAGNOSIS — Z3689 Encounter for other specified antenatal screening: Secondary | ICD-10-CM

## 2016-09-30 DIAGNOSIS — N760 Acute vaginitis: Secondary | ICD-10-CM | POA: Diagnosis not present

## 2016-09-30 DIAGNOSIS — B9689 Other specified bacterial agents as the cause of diseases classified elsewhere: Secondary | ICD-10-CM | POA: Insufficient documentation

## 2016-09-30 NOTE — Progress Notes (Signed)
Post Partum Exam  Stephanie Sanford is a 21 y.o. [phone] female who presents for a postpartum visit. She is 4 weeks postpartum following a spontaneous vaginal delivery. I have fully reviewed the prenatal and intrapartum course. The delivery was at 39 gestational weeks.  Anesthesia: epidural. Postpartum course has been good. Baby's course has been good. Baby is feeding by both breast and bottle - Enfamil Newborn. Bleeding no bleeding. Bowel function is normal. Bladder function is normal. Patient is not sexually active. Contraception method is none. Postpartum depression screening:neg  The following portions of the patient's history were reviewed and updated as appropriate: allergies, current medications, past family history, past medical history, past social history, past surgical history and problem list.  Review of Systems Pertinent items noted in HPI and remainder of comprehensive ROS otherwise negative.    Objective:  unknown if currently breastfeeding.  General:  alert, cooperative and no distress   Breasts:  inspection negative, no nipple discharge or bleeding, no masses or nodularity palpable  Lungs: clear to auscultation bilaterally  Heart:  regular rate and rhythm, S1, S2 normal, no murmur, click, rub or gallop  Abdomen: soft, non-tender; bowel sounds normal; no masses,  no organomegaly   Vulva:  normal  Vagina: normal vagina, no discharge, exudate, lesion, or erythema  Cervix:  no cervical motion tenderness  Corpus: normal size, contour, position, consistency, mobility, non-tender  Adnexa:  normal adnexa  Rectal Exam: Not performed.        Assessment:    Normal 4 week postpartum exam. Pap smear not done at today's visit.    LGSIL: Colposcopy needed  Contraception counseling: wanting Nexplanon  Breast feeding status  Plan:   1. Contraception: abstinence and Nexplanon at a later date 2. Colposcopy for hx of LGSIL 3. Follow up in: 2 weeks for colpo, PRN Nexplanon or as needed.

## 2016-10-01 ENCOUNTER — Other Ambulatory Visit: Payer: Self-pay | Admitting: Certified Nurse Midwife

## 2016-10-01 DIAGNOSIS — B9689 Other specified bacterial agents as the cause of diseases classified elsewhere: Secondary | ICD-10-CM

## 2016-10-01 DIAGNOSIS — N760 Acute vaginitis: Principal | ICD-10-CM

## 2016-10-01 LAB — CERVICOVAGINAL ANCILLARY ONLY
BACTERIAL VAGINITIS: POSITIVE — AB
CANDIDA VAGINITIS: NEGATIVE

## 2016-10-01 MED ORDER — METRONIDAZOLE 500 MG PO TABS: 500.0000 mg | ORAL_TABLET | Freq: Two times a day (BID) | ORAL | 0 refills | Status: DC

## 2016-10-02 ENCOUNTER — Telehealth: Payer: Self-pay

## 2016-10-02 NOTE — Telephone Encounter (Signed)
-----   Message from Roe Coombs, CNM sent at 10/01/2016  4:46 PM EDT ----- Please call patient and notify of +BV results. Metrondiazole 500 mg PO BID x7 days. #14 no refills.  Please tell her not to drink alcohol with this medication as it will cause her to vomit.  Please also ask her if she will need anything for yeast.   Thank you, Orvilla Cornwall CNM

## 2016-10-02 NOTE — Telephone Encounter (Signed)
Unable to contact patient; number is not a working number. Letter sent to patient to contact office.

## 2016-10-16 ENCOUNTER — Encounter: Payer: Self-pay | Admitting: Obstetrics and Gynecology

## 2016-10-16 ENCOUNTER — Ambulatory Visit (INDEPENDENT_AMBULATORY_CARE_PROVIDER_SITE_OTHER): Payer: BLUE CROSS/BLUE SHIELD | Admitting: Obstetrics and Gynecology

## 2016-10-16 VITALS — BP 125/81 | HR 81 | Ht 65.0 in | Wt 186.0 lb

## 2016-10-16 DIAGNOSIS — R87612 Low grade squamous intraepithelial lesion on cytologic smear of cervix (LGSIL): Secondary | ICD-10-CM

## 2016-10-16 DIAGNOSIS — N6310 Unspecified lump in the right breast, unspecified quadrant: Secondary | ICD-10-CM

## 2016-10-16 DIAGNOSIS — Z3202 Encounter for pregnancy test, result negative: Secondary | ICD-10-CM

## 2016-10-16 DIAGNOSIS — Z30017 Encounter for initial prescription of implantable subdermal contraceptive: Secondary | ICD-10-CM | POA: Insufficient documentation

## 2016-10-16 LAB — POCT URINE PREGNANCY: Preg Test, Ur: NEGATIVE

## 2016-10-16 NOTE — Progress Notes (Signed)
Pt here for colpo d/t LGSIL on pap 12/17. First abnormal pap smear.  Just started her cycle today. Desires Implanon.   Abnormal pap smears reviewed with pt.  Pt will return next week for Implanon insertion.  Will repeat pap smear December.

## 2016-10-16 NOTE — Patient Instructions (Signed)
Etonogestrel implant What is this medicine? ETONOGESTREL (et oh noe JES trel) is a contraceptive (birth control) device. It is used to prevent pregnancy. It can be used for up to 3 years. This medicine may be used for other purposes; ask your health care provider or pharmacist if you have questions. COMMON BRAND NAME(S): Implanon, Nexplanon What should I tell my health care provider before I take this medicine? They need to know if you have any of these conditions: -abnormal vaginal bleeding -blood vessel disease or blood clots -cancer of the breast, cervix, or liver -depression -diabetes -gallbladder disease -headaches -heart disease or recent heart attack -high blood pressure -high cholesterol -kidney disease -liver disease -renal disease -seizures -tobacco smoker -an unusual or allergic reaction to etonogestrel, other hormones, anesthetics or antiseptics, medicines, foods, dyes, or preservatives -pregnant or trying to get pregnant -breast-feeding How should I use this medicine? This device is inserted just under the skin on the inner side of your upper arm by a health care professional. Talk to your pediatrician regarding the use of this medicine in children. Special care may be needed. Overdosage: If you think you have taken too much of this medicine contact a poison control center or emergency room at once. NOTE: This medicine is only for you. Do not share this medicine with others. What if I miss a dose? This does not apply. What may interact with this medicine? Do not take this medicine with any of the following medications: -amprenavir -bosentan -fosamprenavir This medicine may also interact with the following medications: -barbiturate medicines for inducing sleep or treating seizures -certain medicines for fungal infections like ketoconazole and itraconazole -grapefruit juice -griseofulvin -medicines to treat seizures like carbamazepine, felbamate, oxcarbazepine,  phenytoin, topiramate -modafinil -phenylbutazone -rifampin -rufinamide -some medicines to treat HIV infection like atazanavir, indinavir, lopinavir, nelfinavir, tipranavir, ritonavir -St. John's wort This list may not describe all possible interactions. Give your health care provider a list of all the medicines, herbs, non-prescription drugs, or dietary supplements you use. Also tell them if you smoke, drink alcohol, or use illegal drugs. Some items may interact with your medicine. What should I watch for while using this medicine? This product does not protect you against HIV infection (AIDS) or other sexually transmitted diseases. You should be able to feel the implant by pressing your fingertips over the skin where it was inserted. Contact your doctor if you cannot feel the implant, and use a non-hormonal birth control method (such as condoms) until your doctor confirms that the implant is in place. If you feel that the implant may have broken or become bent while in your arm, contact your healthcare provider. What side effects may I notice from receiving this medicine? Side effects that you should report to your doctor or health care professional as soon as possible: -allergic reactions like skin rash, itching or hives, swelling of the face, lips, or tongue -breast lumps -changes in emotions or moods -depressed mood -heavy or prolonged menstrual bleeding -pain, irritation, swelling, or bruising at the insertion site -scar at site of insertion -signs of infection at the insertion site such as fever, and skin redness, pain or discharge -signs of pregnancy -signs and symptoms of a blood clot such as breathing problems; changes in vision; chest pain; severe, sudden headache; pain, swelling, warmth in the leg; trouble speaking; sudden numbness or weakness of the face, arm or leg -signs and symptoms of liver injury like dark yellow or brown urine; general ill feeling or flu-like symptoms;  light-colored   stools; loss of appetite; nausea; right upper belly pain; unusually weak or tired; yellowing of the eyes or skin -unusual vaginal bleeding, discharge -signs and symptoms of a stroke like changes in vision; confusion; trouble speaking or understanding; severe headaches; sudden numbness or weakness of the face, arm or leg; trouble walking; dizziness; loss of balance or coordination Side effects that usually do not require medical attention (report to your doctor or health care professional if they continue or are bothersome): -acne -back pain -breast pain -changes in weight -dizziness -general ill feeling or flu-like symptoms -headache -irregular menstrual bleeding -nausea -sore throat -vaginal irritation or inflammation This list may not describe all possible side effects. Call your doctor for medical advice about side effects. You may report side effects to FDA at 1-800-FDA-1088. Where should I keep my medicine? This drug is given in a hospital or clinic and will not be stored at home. NOTE: This sheet is a summary. It may not cover all possible information. If you have questions about this medicine, talk to your doctor, pharmacist, or health care provider.  2018 Elsevier/Gold Standard (2015-08-16 11:19:22)  

## 2016-10-21 ENCOUNTER — Ambulatory Visit: Payer: BLUE CROSS/BLUE SHIELD | Admitting: Obstetrics and Gynecology

## 2017-05-21 ENCOUNTER — Other Ambulatory Visit: Payer: Self-pay

## 2017-05-21 ENCOUNTER — Emergency Department (HOSPITAL_COMMUNITY)
Admission: EM | Admit: 2017-05-21 | Discharge: 2017-05-22 | Disposition: A | Discharge status: Home / Self Care | Payer: Medicaid Other | Attending: Emergency Medicine | Admitting: Emergency Medicine

## 2017-05-21 ENCOUNTER — Encounter (HOSPITAL_COMMUNITY): Payer: Self-pay

## 2017-05-21 DIAGNOSIS — K0889 Other specified disorders of teeth and supporting structures: Secondary | ICD-10-CM | POA: Diagnosis present

## 2017-05-21 DIAGNOSIS — B002 Herpesviral gingivostomatitis and pharyngotonsillitis: Secondary | ICD-10-CM | POA: Insufficient documentation

## 2017-05-21 DIAGNOSIS — K121 Other forms of stomatitis: Secondary | ICD-10-CM

## 2017-05-21 MED ORDER — ACETAMINOPHEN 325 MG PO TABS
650.0000 mg | ORAL_TABLET | Freq: Once | ORAL | Status: AC | PRN
  Administered 2017-05-21: 650 mg via ORAL
  Filled 2017-05-21: qty 2

## 2017-05-21 MED ORDER — MAGIC MOUTHWASH W/LIDOCAINE
10.0000 mL | Freq: Once | ORAL | Status: AC
  Administered 2017-05-22: 10 mL via ORAL
  Filled 2017-05-21: qty 10

## 2017-05-21 MED ORDER — VALACYCLOVIR HCL 500 MG PO TABS
2000.0000 mg | ORAL_TABLET | Freq: Once | ORAL | Status: AC
  Administered 2017-05-22: 2000 mg via ORAL
  Filled 2017-05-21: qty 4

## 2017-05-21 NOTE — ED Provider Notes (Signed)
[facility]-EMERGENCY DEPT Provider Note   CSN: [id number] Arrival date & time: 05/21/17  2116     History   Chief Complaint Chief Complaint  Patient presents with  . Dental Pain  . Sore Throat    HPI Stephanie Sanford is a 22 y.o. female.  Patient presents with sore in her mouth that have been related to periods of high stress in the past. No fever or facial swelling. Swallowing is painful but possible and Stephanie Sanford has been able to manage good fluid intake and manage her secretions. No nausea, vomiting or fever. Sores started developing over the last week.   The history is provided by the patient. No language interpreter was used.  Dental Pain    Sore Throat     Past Medical History:  Diagnosis Date  . Mouth ulcers     Patient Active Problem List   Diagnosis Date Noted  . Nexplanon insertion 10/16/2016  . 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 Sickle cell trait (HCC) 07/10/2016  . Breast mass, right 07/10/2016  . Oral herpes 07/10/2016  . Hepatitis B immune 03/29/2015    Past Surgical History:  Procedure Laterality Date  . NO PAST SURGERIES       OB History    Gravida  4   Para  1   Term  1   Preterm  0   AB  3   Living  1     SAB  2   TAB  1   Ectopic      Multiple  0   Live Births  1            Home Medications    Prior to Admission medications   Medication Sig Start Date End Date Taking? Authorizing Provider  acetaminophen (TYLENOL) 325 MG tablet Take 650 mg by mouth every 6 (six) hours as needed.    [provider]  ACYCLOVIR PO Take by mouth.    [provider]    Family History History reviewed. No pertinent family history.  Social History Social History   Tobacco Use  . Smoking status: Never Smoker  . Smokeless tobacco: Never Used  Substance Use Topics  . Alcohol use: No    Alcohol/week: 0.0 oz  . Drug use: No     Allergies   Tomato   Review of Systems Review of Systems    Constitutional: Negative for fever.  HENT: Positive for mouth sores and sore throat. Negative for facial swelling and trouble swallowing.   Gastrointestinal: Negative for nausea.     Physical Exam Updated Vital Signs BP 123/77 (BP Location: Left Arm)   Pulse 78   Temp 99 F (37.2 C) (Oral)   Resp 16   Ht 5\' 5"  (1.651 m)   Wt 83.9 kg (185 lb)   LMP 05/20/2017   SpO2 100%   BMI 30.79 kg/m   Physical Exam  Constitutional: Stephanie Sanford is oriented to person, place, and time. Stephanie Sanford appears well-developed and well-nourished.  HENT:  Right Ear: Tympanic membrane normal.  Left Ear: Tympanic membrane normal.  Mouth/Throat: Uvula is midline. Mucous membranes are not pale. Oral lesions present. No tonsillar exudate.  Multiple intraoral ulcerations covering the tip of the tongue, pharynx and buccal lower lip. No bleeding.   Neck: Normal range of motion.  Pulmonary/Chest: Effort normal.  Neurological: Stephanie Sanford is alert and oriented to person, place, and time.  Skin: Skin is warm and dry.     ED  Treatments / Results  Labs (all labs ordered are listed, but only abnormal results are displayed) Labs Reviewed  RAPID STREP SCREEN ([facility] ONLY)    EKG None  Radiology No results found.  Procedures Procedures (including critical care time)  Medications Ordered in ED Medications  valACYclovir (VALTREX) tablet 2,000 mg (has no administration in time range)  magic mouthwash w/lidocaine (has no administration in time range)  acetaminophen (TYLENOL) tablet 650 mg (650 mg Oral Given 05/21/17 2240)     Initial Impression / Assessment and Plan / ED Course  I have reviewed the triage vital signs and the nursing notes.  Pertinent labs & imaging results that were available during my care of the patient were reviewed by me and considered in my medical decision making (see chart for details).   3  Patient is here with recurrent and multiple intraoral sores c/w previous herpetic outbreaks in the  past. No fever.   Valtrex started in ED. Will provide magic mouthwash with lidocaine for pain control.   Final Clinical Impressions(s) / ED Diagnoses   Final diagnoses:  None   Herpetic stomatitis   ED Discharge Orders    None       Danne Harbor 05/22/17 0003    Glynn Octave, MD 05/22/17 [phone]

## 2017-05-21 NOTE — ED Triage Notes (Addendum)
Pt c/o mouth ulcers x1 week that have gotten progressively worse. Hx of mouth ulcers in the past. Ulders have spread to back of mouth and throat. Red areas noted in back of throat. Pt reports pain while swallowing, able to swallow but it's painful. One sore noted to right upper lip. Pt reports having worse outbreaks in the past.

## 2017-05-22 MED ORDER — VALACYCLOVIR HCL 1 G PO TABS: 2000.0000 mg | ORAL_TABLET | Freq: Two times a day (BID) | ORAL | 1 refills | Status: AC

## 2017-05-22 MED ORDER — VALACYCLOVIR HCL 1 G PO TABS: 2000.0000 mg | ORAL_TABLET | Freq: Two times a day (BID) | ORAL | 1 refills | Status: DC

## 2017-05-22 MED ORDER — MAGIC MOUTHWASH W/LIDOCAINE: 5.0000 mL | Freq: Four times a day (QID) | ORAL | 0 refills | Status: DC | PRN

## 2018-09-14 IMAGING — US US MFM OB COMP +14 WKS
1 series · 14 of 28 positions shown · non-contrast
Comparison: none

[Series 1: us mfm ob comp +14 wks · 51 acquisitions, 14 frames shown]
[im 2/51]
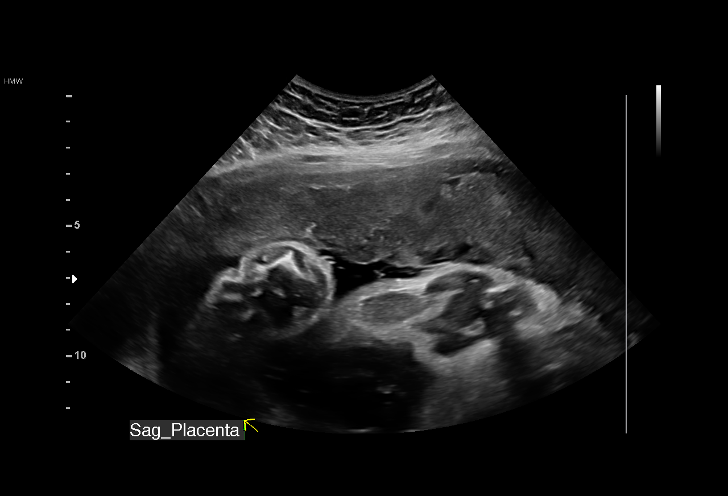
[im 6/51]
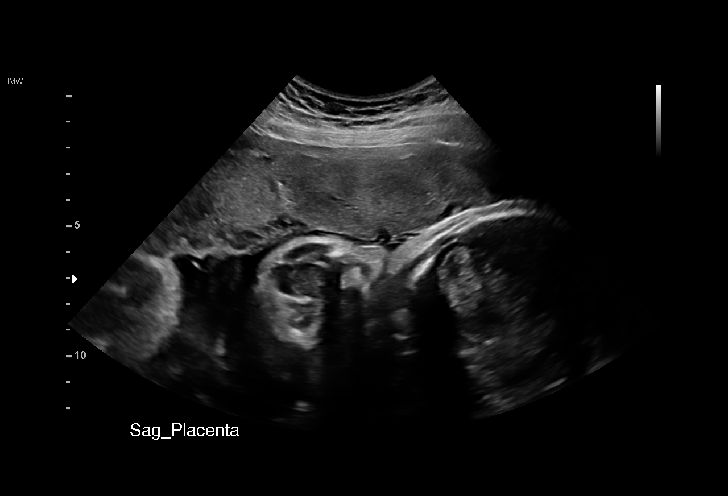
[im 10/51]
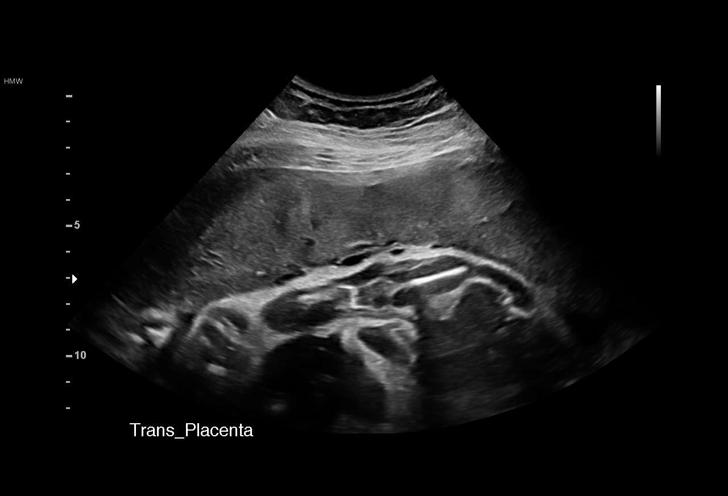
[im 13/51]
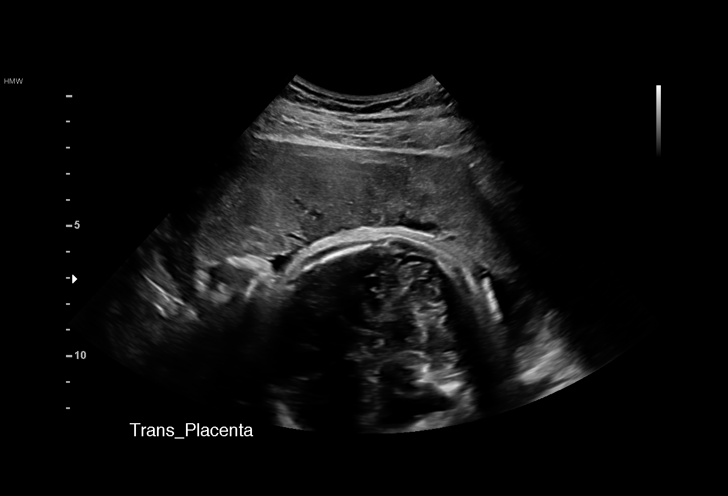
[im 17/51]
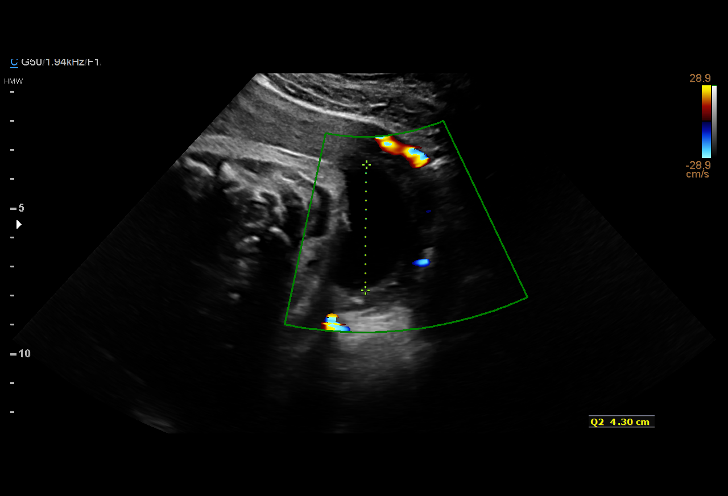
[im 21/51]
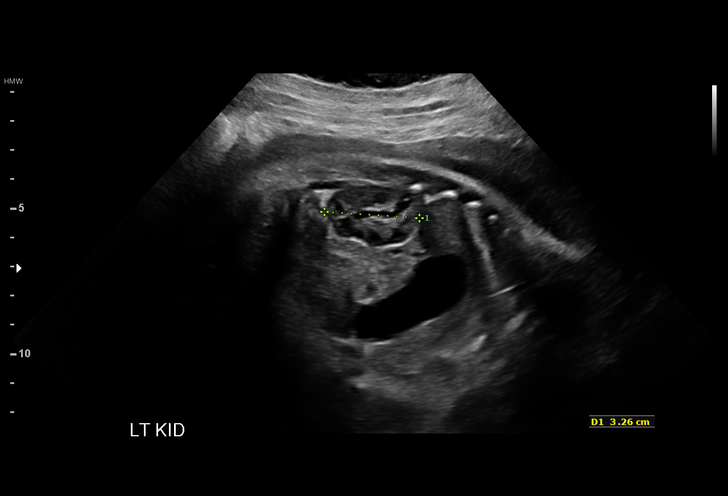
[im 25/51]
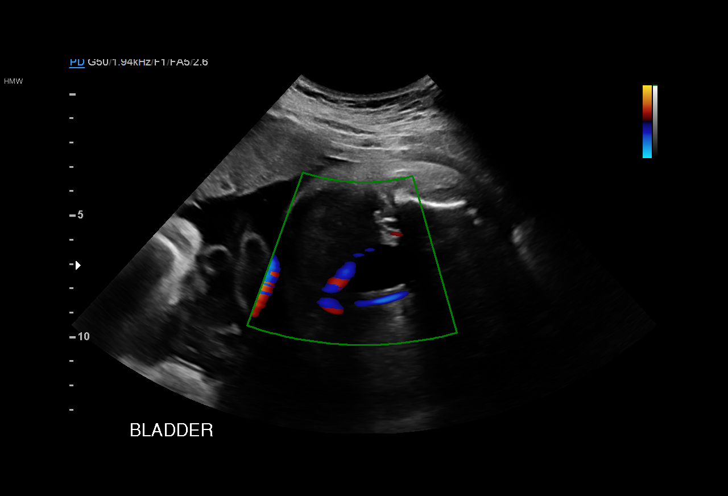
[im 28/51]
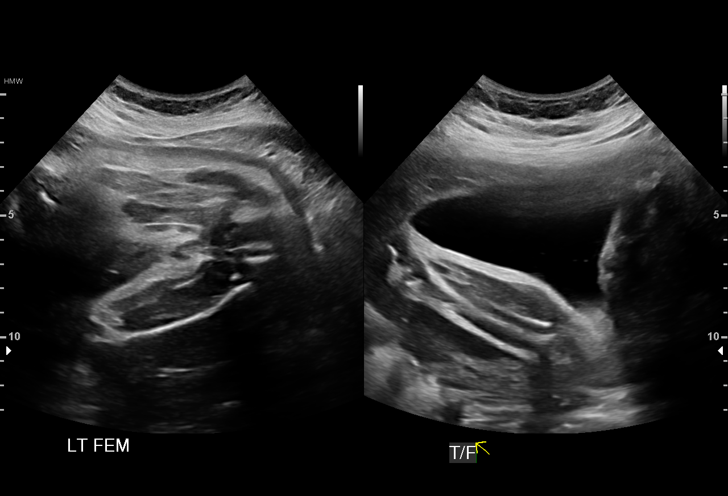
[im 32/51]
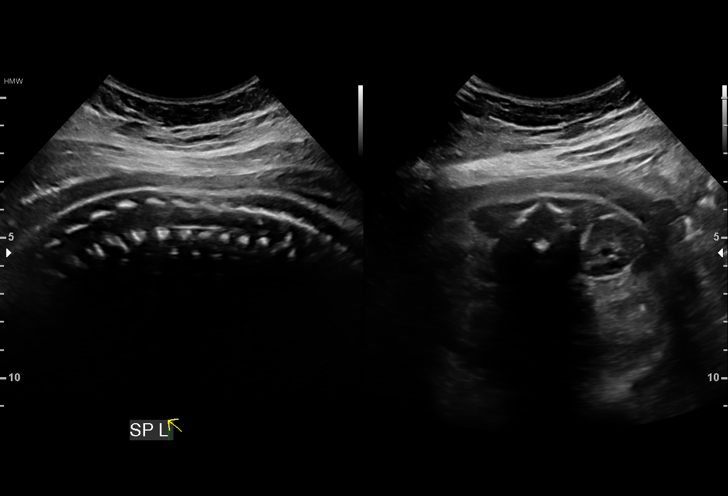
[im 36/51]
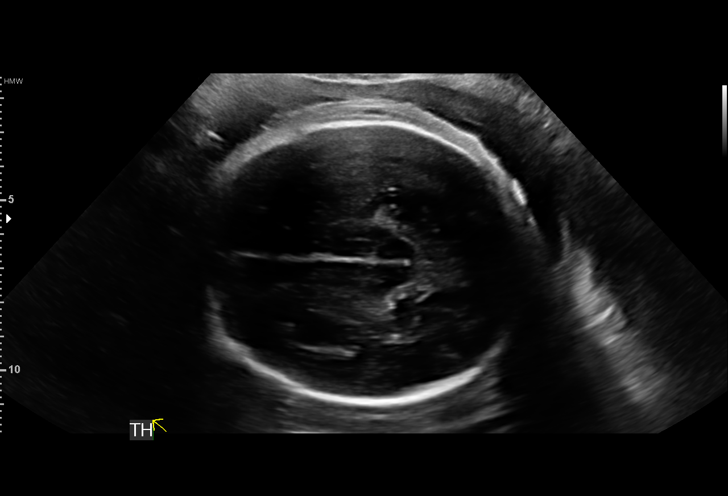
[im 39/51]
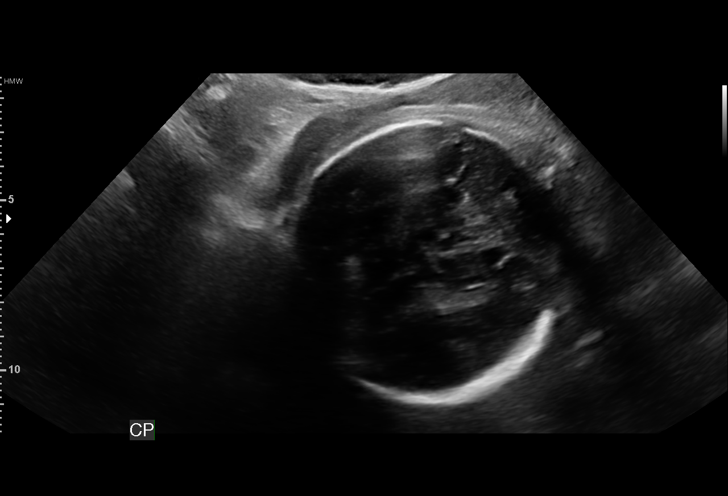
[im 43/51]
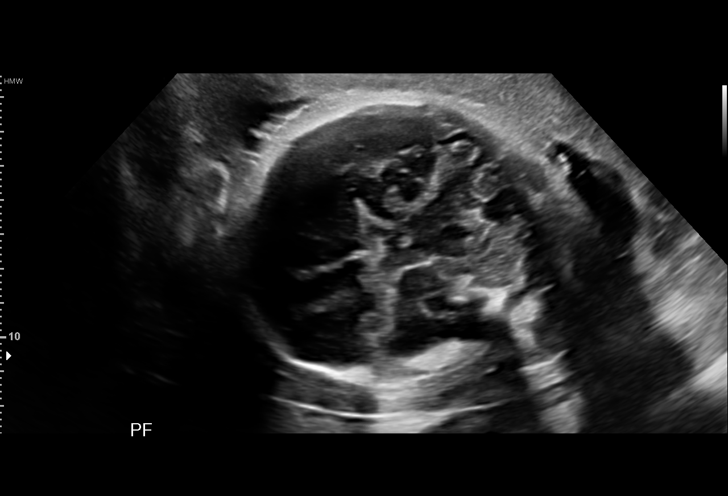
[im 47/51]
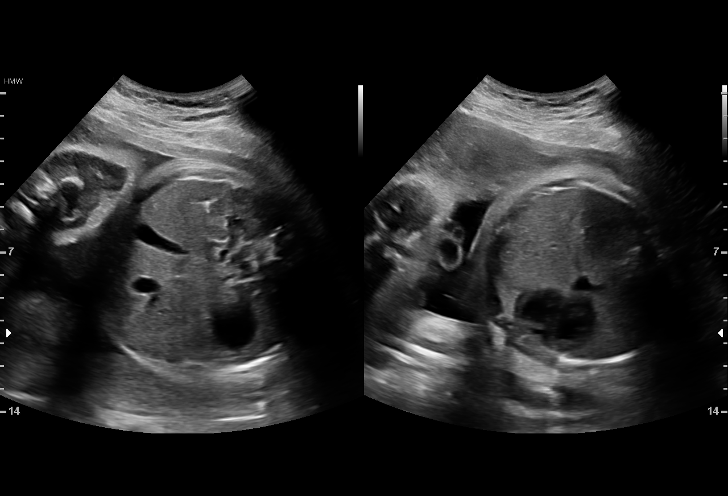
[im 51/51]
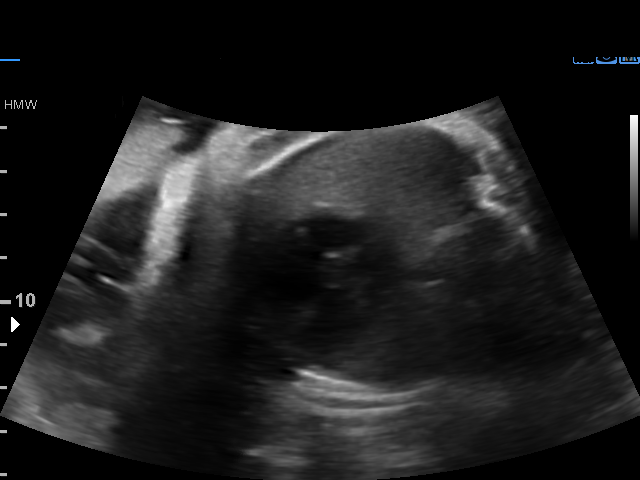

[14 of 28 positions shown; findings below may reference images not displayed]

Indications

33 weeks gestation of pregnancy
Encounter for antenatal screening for
malformations
OB History

Gravidity:    4          SAB:   2
TOP:          1        Living:  0
Fetal Evaluation

Num Of Fetuses:     1
Fetal Heart         155
Rate(bpm):
Cardiac Activity:   Observed
Presentation:       Cephalic
Placenta:           Anterior, above cervical os
P. Cord Insertion:  Visualized, central

Amniotic Fluid
AFI FV:      Subjectively within normal limits

AFI Sum(cm)     %Tile       Largest Pocket(cm)
13.14           42

RUQ(cm)       RLQ(cm)       LUQ(cm)        LLQ(cm)
2.75
Biometry

BPD:      81.2  mm     G. Age:  32w 4d         20  %    CI:        76.93   %    70 - 86
FL/HC:      22.3   %    19.4 -
HC:      293.2  mm     G. Age:  32w 2d        < 3  %    HC/AC:      0.91        0.96 -
AC:      321.8  mm     G. Age:  36w 1d       > 97  %    FL/BPD:     80.7   %    71 - 87
FL:       65.5  mm     G. Age:  33w 5d         44  %    FL/AC:      20.4   %    20 - 24
HUM:      57.3  mm     G. Age:  33w 2d         52  %

Est. FW:    4753  gm      5 lb 8 oz     78  %
Gestational Age

LMP:           33w 4d        Date:  11/30/15                 EDD:   09/05/16
U/S Today:     33w 5d                                        EDD:   09/04/16
Best:          33w 4d     Det. By:  LMP  (11/30/15)          EDD:   09/05/16
Anatomy

Cranium:               Appears normal         Aortic Arch:            Not well visualized
Cavum:                 Not well visualized    Ductal Arch:            Not well visualized
Ventricles:            Appears normal         Diaphragm:              Not well visualized
Choroid Plexus:        Appears normal         Stomach:                Appears normal, left
sided
Cerebellum:            Appears normal         Abdomen:                Appears normal
Posterior Fossa:       Not well visualized    Abdominal Wall:         Not well visualized
Nuchal Fold:           Not applicable (>20    Cord Vessels:           Appears normal (3
wks GA)                                        vessel cord)
Face:                  Not well visualized    Kidneys:                Appear normal
Lips:                  Not well visualized    Bladder:                Appears normal
Thoracic:              Appears normal         Spine:                  Appears normal
Heart:                 Appears normal         Upper Extremities:      Visualized
(4CH, axis, and situs
RVOT:                  Not well visualized    Lower Extremities:      Visualized
LVOT:                  Not well visualized

Other:  Complete fetal anatomic survey previously performed. Technically
difficult due to advanced GA and fetal position.
Cervix Uterus Adnexa

Cervix
Not visualized (advanced GA >55wks)

Uterus
No abnormality visualized.

Left Ovary
No adnexal mass visualized.

Right Ovary
No adnexal mass visualized.

Cul De Sac:   No free fluid seen.

Adnexa:       No abnormality visualized.
Impression

Single living intrauterine pregnancy at 33w1d.
Cephalic presentation.
Anterior placenta without evidence of previa.
Composite fetal growth in the 78th%. AC>97th%ile.
Normal amniotic fluid volume.
The fetal anatomic survey is not complete.
No gross fetal anomalies identified.
Recommendations

Recommend follow-up ultrasound examination in 4 weeks for
completion of fetal anatomic survey and growth.

## 2018-10-14 IMAGING — US US MFM OB FOLLOW-UP
1 series · 14 of 28 positions shown · non-contrast
Comparison: none

[Series 1: us mfm ob follow-up · 14 of 40 slices shown]
[im 2/40]
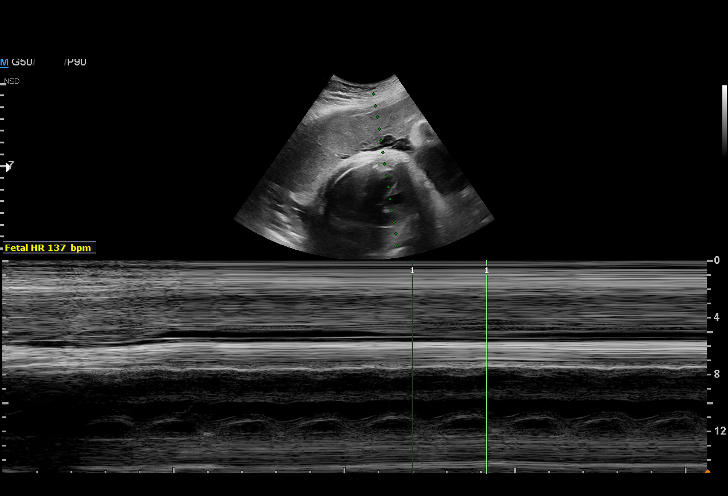
[im 5/40]
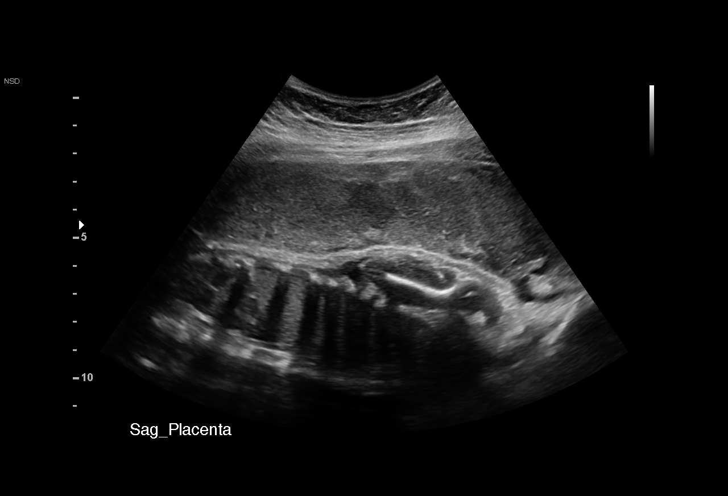
[im 8/40]
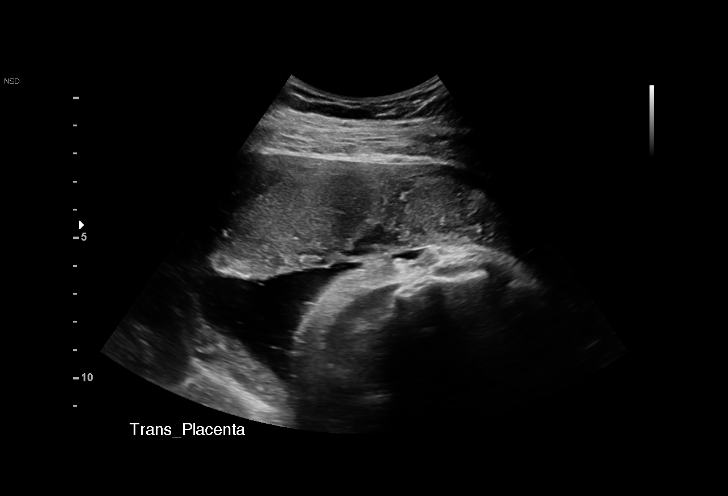
[im 11/40]
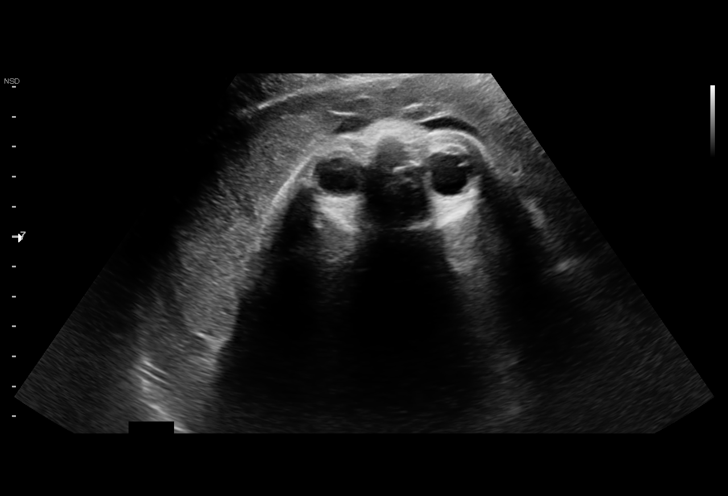
[im 14/40]
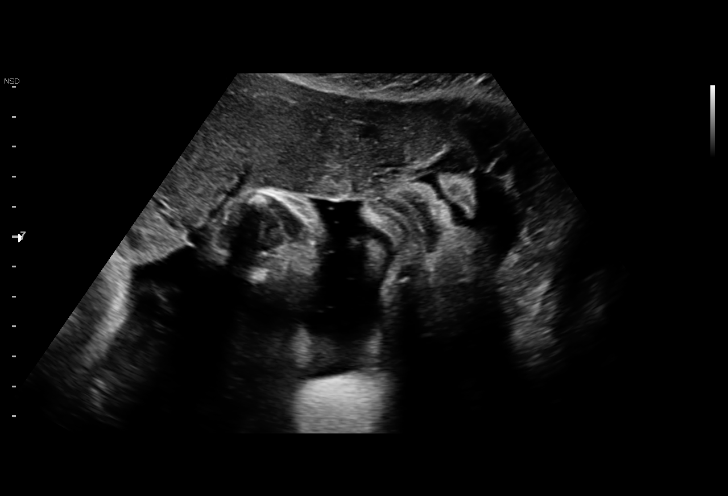
[im 16/40]
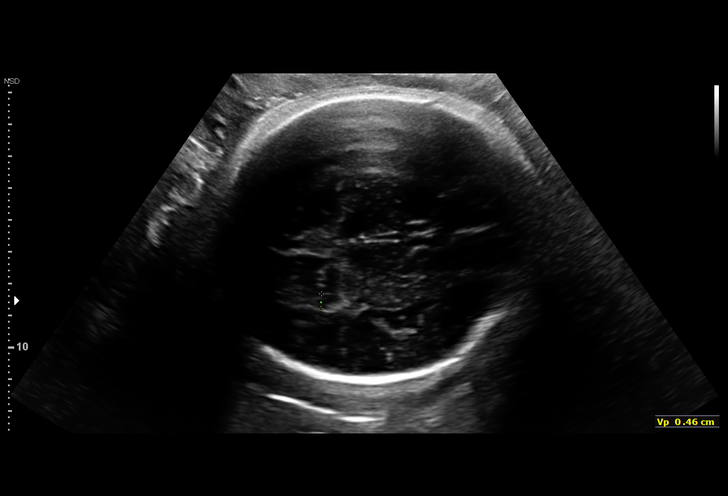
[im 19/40]
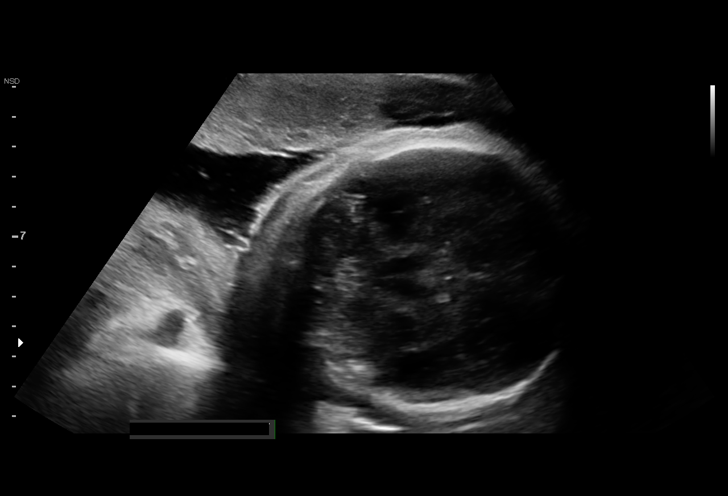
[im 22/40]
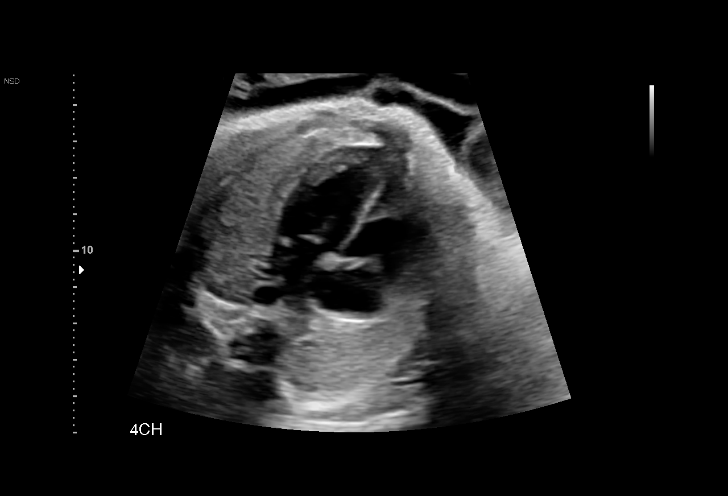
[im 25/40]
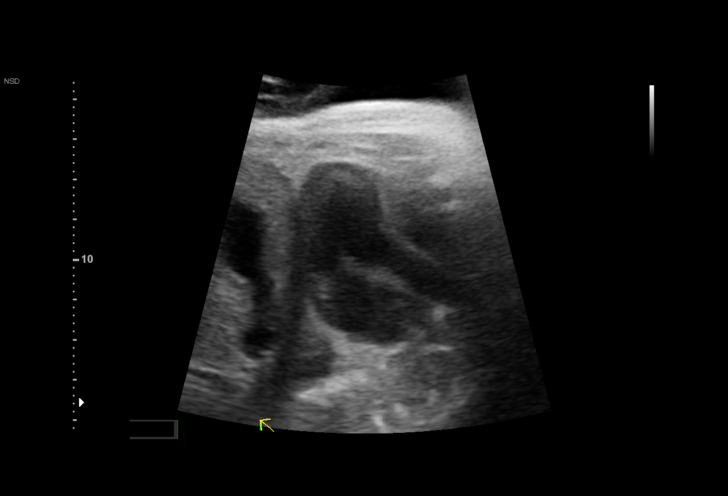
[im 28/40]
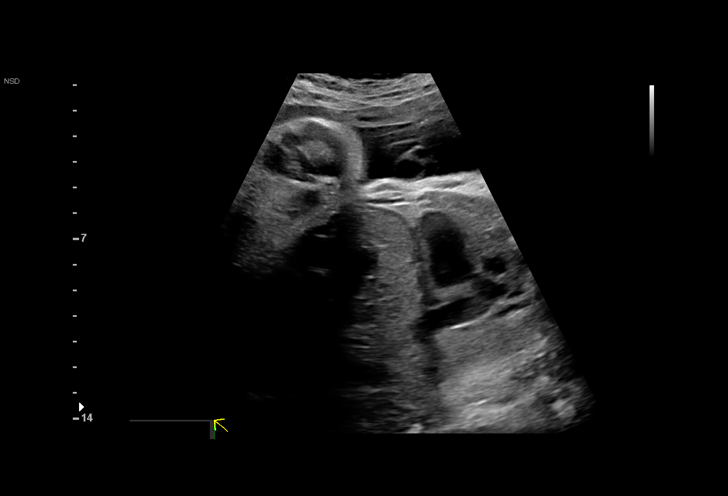
[im 31/40]
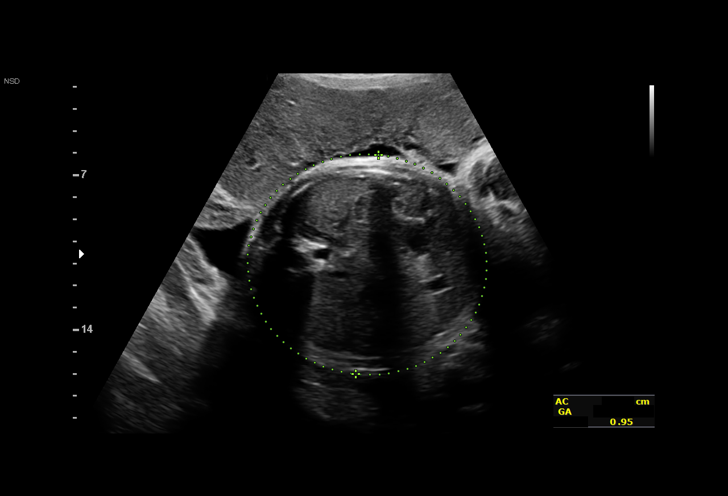
[im 34/40]
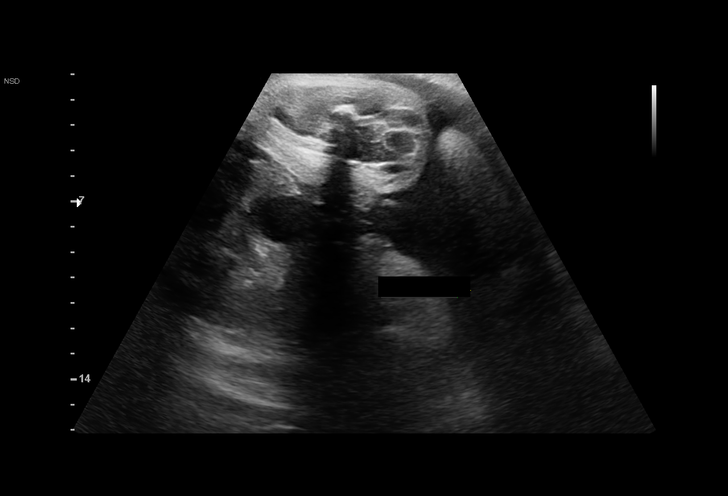
[im 37/40]
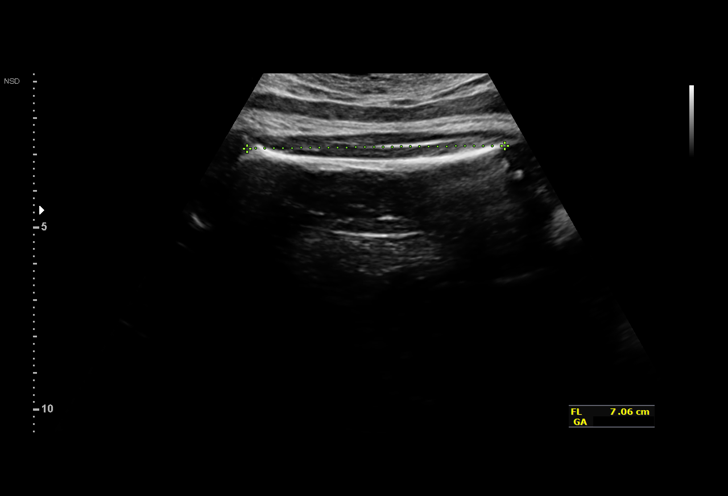
[im 40/40]
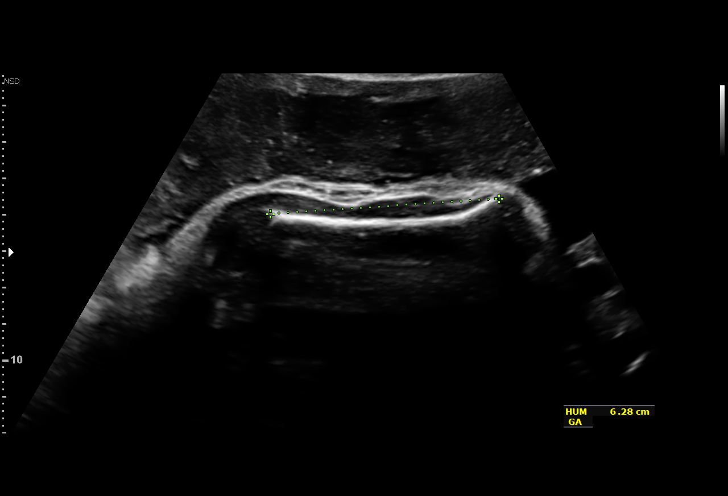

[14 of 28 positions shown; findings below may reference images not displayed]

1  PAN GINGERICH           [id number]      [id number]     [id number]
Indications

37 weeks gestation of pregnancy
Encounter for other antenatal screening
follow-up
OB History

Gravidity:    4          SAB:   2
TOP:          1        Living:  0
Fetal Evaluation

Num Of Fetuses:     1
Fetal Heart         137
Rate(bpm):
Cardiac Activity:   Observed
Presentation:       Cephalic
Placenta:           Anterior, above cervical os
P. Cord Insertion:  Previously Visualized

Amniotic Fluid
AFI FV:      Subjectively within normal limits

AFI Sum(cm)     %Tile       Largest Pocket(cm)
18.03           70

RUQ(cm)       RLQ(cm)       LUQ(cm)        LLQ(cm)
2.98
Biometry
BPD:      88.2  mm     G. Age:  35w 5d         15  %    CI:        80.45   %    70 - 86
FL/HC:      22.7   %    20.9 -
HC:      310.6  mm     G. Age:  34w 5d        < 3  %    HC/AC:      0.94        0.92 -
AC:      328.7  mm     G. Age:  36w 5d         37  %    FL/BPD:     79.9   %    71 - 87
FL:       70.5  mm     G. Age:  36w 1d         15  %    FL/AC:      21.4   %    20 - 24
HUM:        63  mm     G. Age:  36w 4d         52  %

Est. FW:    5685  gm      6 lb 6 oz     39  %
Gestational Age

LMP:           37w 6d        Date:  11/30/15                 EDD:   09/05/16
U/S Today:     35w 6d                                        EDD:   09/19/16
Best:          37w 6d     Det. By:  LMP  (11/30/15)          EDD:   09/05/16
Anatomy

Cranium:               Appears normal         Aortic Arch:            Not well visualized
Cavum:                 Appears normal         Ductal Arch:            Not well visualized
Ventricles:            Appears normal         Diaphragm:              Appears normal
Choroid Plexus:        Previously seen        Stomach:                Appears normal, left
sided
Cerebellum:            Previously seen        Abdomen:                Appears normal
Posterior Fossa:       Not well visualized    Abdominal Wall:         Not well visualized
Nuchal Fold:           Not applicable (>20    Cord Vessels:           Previously seen
wks GA)
Face:                  Appears normal         Kidneys:                Appear normal
(orbits and profile)
Lips:                  Appears normal         Bladder:                Appears normal
Thoracic:              Appears normal         Spine:                  Previously seen
Heart:                 Previously seen        Upper Extremities:      Previously seen
RVOT:                  Not well visualized    Lower Extremities:      Previously seen
LVOT:                  Appears normal

Other:  Complete fetal anatomic survey previously performed. Technically
difficult due to advanced GA and fetal position.
Cervix Uterus Adnexa

Cervix
Not visualized (advanced GA >22wks)
Impression

Single IUP at 37w 6d
Somewhat limited views of the fetal heart, posterior fossa and
abdominal cord insertion obtained due to late gestational age
and fetal position
The remainder of the fetal anatomy appears normal
The estimated fetal weight is at the 39th %tile
Anterior placenta without previa
Normal amniotic fluid volume
Recommendations

Follow up ultrasound as clinically indicated

## 2019-03-25 ENCOUNTER — Encounter (HOSPITAL_COMMUNITY): Payer: Self-pay

## 2019-03-25 ENCOUNTER — Other Ambulatory Visit: Payer: Self-pay

## 2019-03-25 ENCOUNTER — Ambulatory Visit (HOSPITAL_COMMUNITY)
Admission: EM | Admit: 2019-03-25 | Discharge: 2019-03-25 | Disposition: A | Discharge status: Home / Self Care | Payer: Medicaid Other | Attending: Emergency Medicine | Admitting: Emergency Medicine

## 2019-03-25 DIAGNOSIS — Z3202 Encounter for pregnancy test, result negative: Secondary | ICD-10-CM | POA: Diagnosis not present

## 2019-03-25 DIAGNOSIS — N898 Other specified noninflammatory disorders of vagina: Secondary | ICD-10-CM | POA: Insufficient documentation

## 2019-03-25 DIAGNOSIS — N939 Abnormal uterine and vaginal bleeding, unspecified: Secondary | ICD-10-CM | POA: Diagnosis present

## 2019-03-25 LAB — POCT URINALYSIS DIP (DEVICE)
Bilirubin Urine: NEGATIVE
Glucose, UA: NEGATIVE mg/dL
Ketones, ur: NEGATIVE mg/dL
Leukocytes,Ua: NEGATIVE
Nitrite: NEGATIVE
Protein, ur: NEGATIVE mg/dL
Specific Gravity, Urine: >=1.03 (ref 1.005–1.030)
Urobilinogen, UA: 0.2 mg/dL (ref 0.0–1.0)
pH: 5.5 (ref 5.0–8.0)

## 2019-03-25 LAB — POC URINE PREG, ED: Preg Test, Ur: NEGATIVE

## 2019-03-25 LAB — POCT PREGNANCY, URINE: Preg Test, Ur: NEGATIVE

## 2019-03-25 MED ORDER — METRONIDAZOLE 500 MG PO TABS: 500.0000 mg | ORAL_TABLET | Freq: Two times a day (BID) | ORAL | 0 refills | Status: AC

## 2019-03-25 NOTE — ED Provider Notes (Signed)
[facility]    CSN: [id number] Arrival date & time: 03/25/19  1456      History   Chief Complaint Chief Complaint  Patient presents with  . Vaginal Discharge    HPI Stephanie Sanford is a 24 y.o. female.   Stephanie Sanford presents with complaints of yellow vaginal discharge with odor which started a few days ago. She had unprotected sex a week ago with a newer partner, so took a plan b following. She then had vaginal bleeding a few days following this. Bleeding has significantly decreased and now is mainly spotting, but has since developed vaginal discharge. No pelvic pain, no abdominal pain. No back pain. No urinary symptoms. She typically has normal periods, with last on 1/25. No known std exposure.     ROS per HPI, negative if not otherwise mentioned.      Past Medical History:  Diagnosis Date  . Mouth ulcers     Patient Active Problem List   Diagnosis Date Noted  . Nexplanon insertion 10/16/2016  . 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 Sickle cell trait ([place]) 07/10/2016  . Breast mass, right 07/10/2016  . Oral herpes 07/10/2016  . Hepatitis B immune 03/29/2015    Past Surgical History:  Procedure Laterality Date  . NO PAST SURGERIES      OB History    Gravida  4   Para  1   Term  1   Preterm  0   AB  3   Living  1     SAB  2   TAB  1   Ectopic      Multiple  0   Live Births  1            Home Medications    Prior to Admission medications   Medication Sig Start Date End Date Taking? Authorizing Provider  acetaminophen (TYLENOL) 325 MG tablet Take 650 mg by mouth every 6 (six) hours as needed.    [provider]  ACYCLOVIR PO Take by mouth.    [provider]  magic mouthwash w/lidocaine SOLN Take 5 mLs by mouth 4 (four) times daily as needed for mouth pain. Swish in mouth then spit out 05/22/17   Charlann Lange, PA-C  metroNIDAZOLE (FLAGYL) 500 MG tablet Take 1 tablet (500 mg total) by mouth 2 (two) times  daily for 7 days. 03/25/19 04/01/19  Zigmund Gottron, NP    Family History Family History  Problem Relation Age of Onset  . Diabetes Mother   . Healthy Father     Social History Social History   Tobacco Use  . Smoking status: Never Smoker  . Smokeless tobacco: Never Used  Substance Use Topics  . Alcohol use: Yes    Alcohol/week: 0.0 standard drinks  . Drug use: Yes    Types: Marijuana     Allergies   Tomato   Review of Systems Review of Systems   Physical Exam Triage Vital Signs ED Triage Vitals  Enc Vitals Group     BP 03/25/19 1538 124/64     Pulse Rate 03/25/19 1538 90     Resp 03/25/19 1538 18     Temp 03/25/19 1538 98.3 F (36.8 C)     Temp Source 03/25/19 1538 Oral     SpO2 03/25/19 1538 100 %     Weight --      Height --      Head Circumference --  Peak Flow --      Pain Score 03/25/19 1534 0     Pain Loc --      Pain Edu? --      Excl. in GC? --    No data found.  Updated Vital Signs BP 124/64 (BP Location: Right Arm)   Pulse 90   Temp 98.3 F (36.8 C) (Oral)   Resp 18   LMP 03/07/2019   SpO2 100%    Physical Exam Constitutional:      General: She is not in acute distress.    Appearance: She is well-developed.  Cardiovascular:     Rate and Rhythm: Normal rate.  Pulmonary:     Effort: Pulmonary effort is normal.  Abdominal:     Palpations: Abdomen is not rigid.     Tenderness: There is no abdominal tenderness.  Genitourinary:    Comments: Denies sores, lesions, vaginal bleeding; no pelvic pain; gu exam deferred at this time, vaginal self swab collected.   Skin:    General: Skin is warm and dry.  Neurological:     Mental Status: She is alert and oriented to person, place, and time.      UC Treatments / Results  Labs (all labs ordered are listed, but only abnormal results are displayed) Labs Reviewed  POCT URINALYSIS DIP (DEVICE) - Abnormal; Notable for the following components:      Result Value   Hgb urine dipstick  TRACE (*)    All other components within normal limits  POC URINE PREG, ED  POCT PREGNANCY, URINE  CERVICOVAGINAL ANCILLARY ONLY    EKG   Radiology No results found.  Procedures Procedures (including critical care time)  Medications Ordered in UC Medications - No data to display  Initial Impression / Assessment and Plan / UC Course  I have reviewed the triage vital signs and the nursing notes.  Pertinent labs & imaging results that were available during my care of the patient were reviewed by me and considered in my medical decision making (see chart for details).     Bleeding after taking plan b recently. Bleeding has subsided. Now with yellow odorous vaginal discharge. Flagyl for concern for bv initiated with vaginal cytology pending. Return precautions provided. Patient verbalized understanding and agreeable to plan.   Final Clinical Impressions(s) / UC Diagnoses   Final diagnoses:  Vaginal discharge  Abnormal vaginal bleeding     Discharge Instructions     It can be common to have bleeding following a Plan B pill.  We are testing your vaginal swab and will start medication for bacterial vaginosis as we await the results of this swab.  If it is negative you can stop the medication.  Will notify of any positive findings and if any changes to treatment are needed.   If you develop significant pain, heavy bleeding or otherwise worsening of your symptoms please be seen again.  Your period may be somewhat irregular after plan b,  but do take a pregnancy test if much later than usual for you   ED Prescriptions    Medication Sig Dispense Auth. Provider   metroNIDAZOLE (FLAGYL) 500 MG tablet Take 1 tablet (500 mg total) by mouth 2 (two) times daily for 7 days. 14 tablet Georgetta Haber, NP     PDMP not reviewed this encounter.   Georgetta Haber, NP 03/25/19 1620

## 2019-03-25 NOTE — ED Triage Notes (Signed)
Pt c/o vaginal bleeding onset 3 days ago, now with yellow vaginal discharge. Denies dysuria sx, abd pain, n/v.

## 2019-03-25 NOTE — Discharge Instructions (Signed)
It can be common to have bleeding following a Plan B pill.  We are testing your vaginal swab and will start medication for bacterial vaginosis as we await the results of this swab.  If it is negative you can stop the medication.  Will notify of any positive findings and if any changes to treatment are needed.   If you develop significant pain, heavy bleeding or otherwise worsening of your symptoms please be seen again.  Your period may be somewhat irregular after plan b,  but do take a pregnancy test if much later than usual for you

## 2019-03-29 LAB — CERVICOVAGINAL ANCILLARY ONLY
Bacterial vaginitis: POSITIVE — AB
Candida vaginitis: NEGATIVE
Chlamydia: NEGATIVE
Neisseria Gonorrhea: NEGATIVE
Trichomonas: NEGATIVE

## 2019-04-06 ENCOUNTER — Emergency Department (HOSPITAL_COMMUNITY)
Admission: EM | Admit: 2019-04-06 | Discharge: 2019-04-06 | Disposition: A | Discharge status: Home / Self Care | Payer: Medicaid Other | Attending: Emergency Medicine | Admitting: Emergency Medicine

## 2019-04-06 ENCOUNTER — Encounter (HOSPITAL_COMMUNITY): Payer: Self-pay

## 2019-04-06 ENCOUNTER — Other Ambulatory Visit: Payer: Self-pay

## 2019-04-06 DIAGNOSIS — Z79899 Other long term (current) drug therapy: Secondary | ICD-10-CM | POA: Insufficient documentation

## 2019-04-06 DIAGNOSIS — Z760 Encounter for issue of repeat prescription: Secondary | ICD-10-CM | POA: Diagnosis not present

## 2019-04-06 MED ORDER — ACYCLOVIR 400 MG PO TABS: 400.0000 mg | ORAL_TABLET | Freq: Four times a day (QID) | ORAL | 0 refills | Status: DC

## 2019-04-06 MED ORDER — MAGIC MOUTHWASH W/LIDOCAINE: 5.0000 mL | Freq: Four times a day (QID) | ORAL | 0 refills | Status: DC | PRN

## 2019-04-06 NOTE — ED Notes (Signed)
EDP at bedside  

## 2019-04-06 NOTE — ED Notes (Signed)
Pt verbalizes understanding of DC instructions. Pt belongings returned and is ambulatory out of ED.   Signature pad not available  

## 2019-04-06 NOTE — ED Provider Notes (Signed)
[facility]-EMERGENCY DEPT Provider Note   CSN: [id number] Arrival date & time: 04/06/19  2152     History Chief Complaint  Patient presents with  . Medication Refill    Stephanie Sanford is a 24 y.o. female.  24 year old female requesting refill of her acyclovir as well as Magic mouthwash.  Has a history of oral ulcers and is currently experiencing some to the tip of her tongue.  Denies any trouble swallowing.  No fever or chills.  No treatment use prior to arrival        Past Medical History:  Diagnosis Date  . Mouth ulcers     Patient Active Problem List   Diagnosis Date Noted  . Nexplanon insertion 10/16/2016  . 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 Sickle cell trait ([facility]) 07/10/2016  . Breast mass, right 07/10/2016  . Oral herpes 07/10/2016  . Hepatitis B immune 03/29/2015    Past Surgical History:  Procedure Laterality Date  . NO PAST SURGERIES       OB History    Gravida  4   Para  1   Term  1   Preterm  0   AB  3   Living  1     SAB  2   TAB  1   Ectopic      Multiple  0   Live Births  1           Family History  Problem Relation Age of Onset  . Diabetes Mother   . Healthy Father     Social History   Tobacco Use  . Smoking status: Never Smoker  . Smokeless tobacco: Never Used  Substance Use Topics  . Alcohol use: Yes    Alcohol/week: 0.0 standard drinks  . Drug use: Yes    Types: Marijuana    Home Medications Prior to Admission medications   Medication Sig Start Date End Date Taking? Authorizing Provider  acetaminophen (TYLENOL) 325 MG tablet Take 650 mg by mouth every 6 (six) hours as needed.    [provider]  ACYCLOVIR PO Take by mouth.    [provider]  magic mouthwash w/lidocaine SOLN Take 5 mLs by mouth 4 (four) times daily as needed for mouth pain. Swish in mouth then spit out 05/22/17   Elpidio Anis, PA-C    Allergies    Tomato  Review of Systems   Review of  Systems  All other systems reviewed and are negative.   Physical Exam Updated Vital Signs BP 117/68 (BP Location: Right Arm)   Pulse (!) 103   Temp 98.6 F (37 C) (Oral)   Resp 15   Ht 1.651 m (5\' 5" )   Wt 86.2 kg   LMP 03/07/2019   SpO2 100%   BMI 31.62 kg/m   Physical Exam Vitals and nursing note reviewed.  Constitutional:      Appearance: She is well-developed. She is not toxic-appearing.  HENT:     Head: Normocephalic and atraumatic.     Mouth/Throat:   Eyes:     Conjunctiva/sclera: Conjunctivae normal.     Pupils: Pupils are equal, round, and reactive to light.  Cardiovascular:     Rate and Rhythm: Normal rate.  Pulmonary:     Effort: Pulmonary effort is normal.  Musculoskeletal:     Cervical back: Normal range of motion.  Skin:    General: Skin is warm and dry.  Neurological:     Mental Status: She  is alert and oriented to person, place, and time.     ED Results / Procedures / Treatments   Labs (all labs ordered are listed, but only abnormal results are displayed) Labs Reviewed - No data to display  EKG None  Radiology No results found.  Procedures Procedures (including critical care time)  Medications Ordered in ED Medications - No data to display  ED Course  I have reviewed the triage vital signs and the nursing notes.  Pertinent labs & imaging results that were available during my care of the patient were reviewed by me and considered in my medical decision making (see chart for details).    MDM Rules/Calculators/A&P                      We will follow patient's Magic mouthwash and cycle.  Well discharged home Final Clinical Impression(s) / ED Diagnoses Final diagnoses:  None    Rx / DC Orders ED Discharge Orders    None       Lacretia Leigh, MD 04/06/19 2230

## 2019-04-06 NOTE — Discharge Instructions (Addendum)
Followup with your doctor as needed

## 2019-04-06 NOTE — ED Triage Notes (Signed)
Pt reports that she is out of her acyclovir and needs a refill.

## 2020-11-02 ENCOUNTER — Ambulatory Visit (HOSPITAL_COMMUNITY)
Admission: EM | Admit: 2020-11-02 | Discharge: 2020-11-02 | Disposition: A | Discharge status: Home / Self Care | Payer: Medicaid Other | Attending: Physician Assistant | Admitting: Physician Assistant

## 2020-11-02 ENCOUNTER — Encounter (HOSPITAL_COMMUNITY): Payer: Self-pay

## 2020-11-02 ENCOUNTER — Other Ambulatory Visit: Payer: Self-pay

## 2020-11-02 DIAGNOSIS — K120 Recurrent oral aphthae: Secondary | ICD-10-CM | POA: Diagnosis not present

## 2020-11-02 MED ORDER — TRIAMCINOLONE ACETONIDE 0.1 % MT PSTE: 1.0000 "application " | PASTE | Freq: Two times a day (BID) | OROMUCOSAL | 12 refills | Status: DC

## 2020-11-02 MED ORDER — ACYCLOVIR 400 MG PO TABS: 400.0000 mg | ORAL_TABLET | Freq: Three times a day (TID) | ORAL | 0 refills | Status: DC

## 2020-11-02 NOTE — Discharge Instructions (Signed)
Begin acyclovir as previously prescribed.  You can use [employer] to help manage pain with symptoms.  Alternate Tylenol and ibuprofen for additional pain relief.  Make sure you are eating and drinking normally and if you have difficulty swallowing you need to be reevaluated.  If you develop any worsening symptoms including high fever, inability to swallow, muffled voice, swelling of your tongue or throat you should be seen again.

## 2020-11-02 NOTE — ED Triage Notes (Signed)
Pt c/o mouth sores x1wk, hx of same.

## 2020-11-02 NOTE — ED Provider Notes (Signed)
[facility]    CSN: [id number] Arrival date & time: 11/02/20  8828      History   Chief Complaint Chief Complaint  Patient presents with   Mouth Lesions    HPI Stephanie Sanford is a 25 y.o. female.   Patient presents today with a 1 week history of widespread mouth ulcers.  Reports this has been an ongoing problem since the age of 34 often requiring acyclovir for management of symptoms.  Reports significant pain which is rated 8 on a 0-10 pain scale, localized to lesions, described as burning, worse with eating or exposure to certain things, no alleviating factors identified.  She has tried over-the-counter medications without improvement of symptoms.  She is able to eat and drink despite symptoms.  Denies any fever, abdominal pain, nausea, vomiting, chest pain, shortness of breath.  She denies any medication changes.  Denies history of chemotherapy.  Denies any immunosuppression or history of HIV.   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 Authorizing Provider  triamcinolone (KENALOG) 0.1 % paste Use as directed 1 application in the mouth or throat 2 (two) times daily. 11/02/20  Yes Aja Bolander, Noberto Retort, PA-C  acetaminophen (TYLENOL) 325 MG tablet Take 650 mg by mouth every 6 (six) hours as needed.    [provider]  acyclovir (ZOVIRAX) 400 MG  tablet Take 1 tablet (400 mg total) by mouth 3 (three) times daily. 11/02/20   Dallyn Bergland, Noberto Retort, PA-C  magic mouthwash w/lidocaine SOLN Take 5 mLs by mouth 4 (four) times daily as needed for mouth pain. Swish in mouth then spit out 04/06/19   Lorre Nick, MD    Family History Family History  Problem Relation Age of Onset   Diabetes Mother    Healthy Father     Social History Social History   Tobacco Use   Smoking status: Never   Smokeless tobacco: Never  Vaping Use   Vaping Use: Never used  Substance Use Topics   Alcohol use: Yes    Alcohol/week: 0.0 standard drinks   Drug use: Yes    Types: Marijuana     Allergies   Tomato   Review of Systems Review of Systems  Constitutional:  Negative for activity change, appetite change, fatigue and fever.  HENT:  Positive for mouth sores. Negative for congestion, postnasal drip, sinus pressure, sneezing and sore throat.   Respiratory:  Negative for cough and shortness of breath.   Cardiovascular:  Negative for chest pain.  Gastrointestinal:  Negative for abdominal pain, constipation,  diarrhea, nausea and vomiting.  Musculoskeletal:  Negative for arthralgias and myalgias.  Neurological:  Positive for headaches. Negative for dizziness and light-headedness.    Physical Exam Triage Vital Signs ED Triage Vitals  Enc Vitals Group     BP 11/02/20 1035 130/69     Pulse Rate 11/02/20 1035 83     Resp 11/02/20 1035 18     Temp 11/02/20 1035 98.7 F (37.1 C)     Temp Source 11/02/20 1035 Oral     SpO2 11/02/20 1035 100 %     Weight --      Height --      Head Circumference --      Peak Flow --      Pain Score 11/02/20 1036 7     Pain Loc --      Pain Edu? --      Excl. in GC? --    No data found.  Updated Vital Signs BP 130/69 (BP Location: Left Arm)   Pulse 83   Temp 98.7 F (37.1 C) (Oral)   Resp 18   LMP 10/22/2020   SpO2 100%   Breastfeeding No   Visual Acuity Right Eye Distance:   Left Eye Distance:    Bilateral Distance:    Right Eye Near:   Left Eye Near:    Bilateral Near:     Physical Exam Vitals reviewed.  Constitutional:      General: She is awake. She is not in acute distress.    Appearance: Normal appearance. She is well-developed. She is not ill-appearing.     Comments: Very pleasant female appears stated age in no acute distress sitting comfortably in exam room  HENT:     Head: Normocephalic and atraumatic.     Right Ear: Tympanic membrane, ear canal and external ear normal. Tympanic membrane is not erythematous or bulging.     Left Ear: Tympanic membrane, ear canal and external ear normal. Tympanic membrane is not erythematous or bulging.     Nose:     Right Sinus: No maxillary sinus tenderness or frontal sinus tenderness.     Left Sinus: No maxillary sinus tenderness or frontal sinus tenderness.     Mouth/Throat:     Mouth: Oral lesions present.     Pharynx: Uvula midline. Posterior oropharyngeal erythema present. No oropharyngeal exudate.     Comments: Multiple ulcerations with surrounding erythema noted hard and soft palate and buccal mucosa. Cardiovascular:     Rate and Rhythm: Normal rate and regular rhythm.     Heart sounds: Normal heart sounds, S1 normal and S2 normal. No murmur heard. Pulmonary:     Effort: Pulmonary effort is normal.     Breath sounds: Normal breath sounds. No wheezing, rhonchi or rales.     Comments: Clear to auscultation bilaterally Psychiatric:        Behavior: Behavior is cooperative.     UC Treatments / Results  Labs (all labs ordered are listed, but only abnormal results are displayed) Labs Reviewed - No data to display  EKG   Radiology No results found.  Procedures Procedures (including critical care time)  Medications Ordered in UC Medications - No data to display  Initial Impression / Assessment and Plan / UC Course  I have reviewed the triage vital signs and the nursing notes.  Pertinent labs & imaging results  that were available during my care of the patient were reviewed by me and considered in my medical decision making (see chart  for details).      Vital signs and physical exam reassuring today with no evidence of dehydration.  Patient reports she is able to eat and drink despite symptoms.  She was given refill of acyclovir and Orabase to be used to help manage lesion discomfort.  Recommend she alternate over-the-counter medications such as Tylenol and ibuprofen for symptom relief.  Discussed alarm symptoms that warrant emergent evaluation.  Strict return precautions given to which she expressed understanding.  Final Clinical Impressions(s) / UC Diagnoses   Final diagnoses:  Aphthous stomatitis     Discharge Instructions      Begin acyclovir as previously prescribed.  You can use Orabase to help manage pain with symptoms.  Alternate Tylenol and ibuprofen for additional pain relief.  Make sure you are eating and drinking normally and if you have difficulty swallowing you need to be reevaluated.  If you develop any worsening symptoms including high fever, inability to swallow, muffled voice, swelling of your tongue or throat you should be seen again.     ED Prescriptions     Medication Sig Dispense Auth. Provider   acyclovir (ZOVIRAX) 400 MG tablet Take 1 tablet (400 mg total) by mouth 3 (three) times daily. 30 tablet Nastacia Raybuck K, PA-C   triamcinolone (KENALOG) 0.1 % paste Use as directed 1 application in the mouth or throat 2 (two) times daily. 5 g Mindel Friscia K, PA-C      PDMP not reviewed this encounter.   Jeani Hawking, PA-C 11/02/20 1130

## 2021-07-24 ENCOUNTER — Other Ambulatory Visit: Payer: Self-pay | Admitting: Radiology

## 2021-10-21 LAB — OB RESULTS CONSOLE ABO/RH: RH Type: POSITIVE

## 2021-10-21 LAB — OB RESULTS CONSOLE RPR: RPR: NONREACTIVE

## 2021-10-21 LAB — OB RESULTS CONSOLE ANTIBODY SCREEN: Antibody Screen: NEGATIVE

## 2021-10-21 LAB — OB RESULTS CONSOLE RUBELLA ANTIBODY, IGM: Rubella: IMMUNE

## 2021-10-21 LAB — OB RESULTS CONSOLE HEPATITIS B SURFACE ANTIGEN: Hepatitis B Surface Ag: NEGATIVE

## 2021-10-21 LAB — OB RESULTS CONSOLE GC/CHLAMYDIA
Chlamydia: NEGATIVE
Neisseria Gonorrhea: NEGATIVE

## 2021-10-21 LAB — OB RESULTS CONSOLE HIV ANTIBODY (ROUTINE TESTING): HIV: NONREACTIVE

## 2021-10-21 LAB — HEPATITIS C ANTIBODY: HCV Ab: NEGATIVE

## 2022-02-10 NOTE — L&D Delivery Note (Signed)
Delivery Note Labor onset:   04/28/22 Labor Onset Time: 0230 Complete dilation at   Onset of pushing at 1050 FHR second stage: pt too uncomfortable to assess FHR Analgesia/Anesthesia intrapartum: none  Guided pushing with maternal urge. Delivery of a viable female at 77. Fetal head delivered in LOA position.  Nuchal cord: none.  Infant placed on maternal abd, dried, and tactile stim.  Cord double clamped after 2 min and cut by father.  RN x4 present for birth.  Cord blood sample collected: Yes Arterial cord blood sample collected: No  Placenta delivered Delena Bali, intact, with 3 VC.  Placenta to L&D. Uterine tone firm, bleeding brisk, firm with massage, TXA administered  No laceration identified.  Anesthesia: none QBL(mL): [id number] Complications: none APGAR: APGAR (1 MIN):   APGAR (5 MINS):   APGAR (10 MINS):   Mom to postpartum.  Baby to Couplet care / Skin to Skin.  Arrie Eastern DNP, CNM 04/28/2022, 11:14 AM

## 2022-04-01 LAB — OB RESULTS CONSOLE GBS: GBS: NEGATIVE

## 2022-04-28 ENCOUNTER — Inpatient Hospital Stay (HOSPITAL_COMMUNITY)
Admission: AD | Admit: 2022-04-28 | Discharge: 2022-04-30 | DRG: 807 | Disposition: A | Discharge status: Home / Self Care | Payer: Medicaid Other | Attending: Obstetrics and Gynecology | Admitting: Obstetrics and Gynecology

## 2022-04-28 ENCOUNTER — Encounter (HOSPITAL_COMMUNITY): Payer: Self-pay | Admitting: *Deleted

## 2022-04-28 ENCOUNTER — Other Ambulatory Visit: Payer: Self-pay

## 2022-04-28 DIAGNOSIS — D573 Sickle-cell trait: Secondary | ICD-10-CM | POA: Diagnosis present

## 2022-04-28 DIAGNOSIS — O9902 Anemia complicating childbirth: Secondary | ICD-10-CM | POA: Diagnosis present

## 2022-04-28 DIAGNOSIS — O26893 Other specified pregnancy related conditions, third trimester: Secondary | ICD-10-CM | POA: Diagnosis present

## 2022-04-28 DIAGNOSIS — Z3A40 40 weeks gestation of pregnancy: Secondary | ICD-10-CM | POA: Diagnosis not present

## 2022-04-28 LAB — CBC
HCT: 30.6 % — ABNORMAL LOW (ref 36.0–46.0)
Hemoglobin: 9.1 g/dL — ABNORMAL LOW (ref 12.0–15.0)
MCH: 21.2 pg — ABNORMAL LOW (ref 26.0–34.0)
MCHC: 29.7 g/dL — ABNORMAL LOW (ref 30.0–36.0)
MCV: 71.2 fL — ABNORMAL LOW (ref 80.0–100.0)
Platelets: 436 10*3/uL — ABNORMAL HIGH (ref 150–400)
RBC: 4.3 MIL/uL (ref 3.87–5.11)
RDW: 17.2 % — ABNORMAL HIGH (ref 11.5–15.5)
WBC: 11.7 10*3/uL — ABNORMAL HIGH (ref 4.0–10.5)
nRBC: 0 % (ref 0.0–0.2)

## 2022-04-28 LAB — TYPE AND SCREEN
ABO/RH(D): AB POS
Antibody Screen: NEGATIVE

## 2022-04-28 LAB — HIV ANTIBODY (ROUTINE TESTING W REFLEX): HIV Screen 4th Generation wRfx: NONREACTIVE

## 2022-04-28 MED ORDER — TETANUS-DIPHTH-ACELL PERTUSSIS 5-2.5-18.5 LF-MCG/0.5 IM SUSY: 0.5000 mL | PREFILLED_SYRINGE | Freq: Once | INTRAMUSCULAR | Status: DC

## 2022-04-28 MED ORDER — DIBUCAINE (PERIANAL) 1 % EX OINT: 1.0000 | TOPICAL_OINTMENT | CUTANEOUS | Status: DC | PRN

## 2022-04-28 MED ORDER — DIPHENHYDRAMINE HCL 25 MG PO CAPS: 25.0000 mg | ORAL_CAPSULE | Freq: Four times a day (QID) | ORAL | Status: DC | PRN

## 2022-04-28 MED ORDER — OXYTOCIN BOLUS FROM INFUSION
333.0000 mL | Freq: Once | INTRAVENOUS | Status: AC
  Administered 2022-04-28: 333 mL via INTRAVENOUS

## 2022-04-28 MED ORDER — FENTANYL CITRATE (PF) 100 MCG/2ML IJ SOLN
50.0000 ug | INTRAMUSCULAR | Status: DC | PRN
  Administered 2022-04-28: 100 ug via INTRAVENOUS
  Filled 2022-04-28: qty 2

## 2022-04-28 MED ORDER — FLEET ENEMA 7-19 GM/118ML RE ENEM: 1.0000 | ENEMA | RECTAL | Status: DC | PRN

## 2022-04-28 MED ORDER — ONDANSETRON HCL 4 MG/2ML IJ SOLN: 4.0000 mg | INTRAMUSCULAR | Status: DC | PRN

## 2022-04-28 MED ORDER — EPHEDRINE 5 MG/ML INJ: 10.0000 mg | INTRAVENOUS | Status: DC | PRN

## 2022-04-28 MED ORDER — TRANEXAMIC ACID-NACL 1000-0.7 MG/100ML-% IV SOLN
INTRAVENOUS | Status: AC
  Filled 2022-04-28: qty 100

## 2022-04-28 MED ORDER — LIDOCAINE HCL (PF) 1 % IJ SOLN: 30.0000 mL | INTRAMUSCULAR | Status: DC | PRN

## 2022-04-28 MED ORDER — TRANEXAMIC ACID-NACL 1000-0.7 MG/100ML-% IV SOLN: 1000.0000 mg | Freq: Once | INTRAVENOUS | Status: DC | PRN

## 2022-04-28 MED ORDER — PHENYLEPHRINE 80 MCG/ML (10ML) SYRINGE FOR IV PUSH (FOR BLOOD PRESSURE SUPPORT): 80.0000 ug | PREFILLED_SYRINGE | INTRAVENOUS | Status: DC | PRN

## 2022-04-28 MED ORDER — ONDANSETRON HCL 4 MG/2ML IJ SOLN
4.0000 mg | Freq: Four times a day (QID) | INTRAMUSCULAR | Status: DC | PRN
  Filled 2022-04-28: qty 2

## 2022-04-28 MED ORDER — OXYCODONE-ACETAMINOPHEN 5-325 MG PO TABS: 2.0000 | ORAL_TABLET | ORAL | Status: DC | PRN

## 2022-04-28 MED ORDER — FENTANYL-BUPIVACAINE-NACL 0.5-0.125-0.9 MG/250ML-% EP SOLN: 12.0000 mL/h | EPIDURAL | Status: DC | PRN

## 2022-04-28 MED ORDER — COCONUT OIL OIL: 1.0000 | TOPICAL_OIL | Status: DC | PRN

## 2022-04-28 MED ORDER — ACETAMINOPHEN 325 MG PO TABS: 650.0000 mg | ORAL_TABLET | ORAL | Status: DC | PRN

## 2022-04-28 MED ORDER — SENNOSIDES-DOCUSATE SODIUM 8.6-50 MG PO TABS
2.0000 | ORAL_TABLET | ORAL | Status: DC
  Administered 2022-04-28: 2 via ORAL
  Filled 2022-04-28: qty 2

## 2022-04-28 MED ORDER — WITCH HAZEL-GLYCERIN EX PADS: 1.0000 | MEDICATED_PAD | CUTANEOUS | Status: DC | PRN

## 2022-04-28 MED ORDER — ACETAMINOPHEN 325 MG PO TABS
650.0000 mg | ORAL_TABLET | Freq: Four times a day (QID) | ORAL | Status: DC
  Administered 2022-04-28 – 2022-04-29 (×5): 650 mg via ORAL
  Filled 2022-04-28 (×5): qty 2

## 2022-04-28 MED ORDER — LACTATED RINGERS IV SOLN: 500.0000 mL | Freq: Once | INTRAVENOUS | Status: DC

## 2022-04-28 MED ORDER — IBUPROFEN 600 MG PO TABS
600.0000 mg | ORAL_TABLET | Freq: Four times a day (QID) | ORAL | Status: DC
  Administered 2022-04-28 – 2022-04-29 (×6): 600 mg via ORAL
  Filled 2022-04-28 (×6): qty 1

## 2022-04-28 MED ORDER — BENZOCAINE-MENTHOL 20-0.5 % EX AERO
1.0000 | INHALATION_SPRAY | CUTANEOUS | Status: DC | PRN
  Filled 2022-04-28: qty 56

## 2022-04-28 MED ORDER — OXYCODONE-ACETAMINOPHEN 5-325 MG PO TABS: 1.0000 | ORAL_TABLET | ORAL | Status: DC | PRN

## 2022-04-28 MED ORDER — OXYTOCIN-SODIUM CHLORIDE 30-0.9 UT/500ML-% IV SOLN
2.5000 [IU]/h | INTRAVENOUS | Status: DC
  Filled 2022-04-28: qty 500

## 2022-04-28 MED ORDER — HYDROXYZINE HCL 50 MG PO TABS: 50.0000 mg | ORAL_TABLET | Freq: Four times a day (QID) | ORAL | Status: DC | PRN

## 2022-04-28 MED ORDER — DIPHENHYDRAMINE HCL 50 MG/ML IJ SOLN: 12.5000 mg | INTRAMUSCULAR | Status: DC | PRN

## 2022-04-28 MED ORDER — ONDANSETRON HCL 4 MG PO TABS: 4.0000 mg | ORAL_TABLET | ORAL | Status: DC | PRN

## 2022-04-28 MED ORDER — SOD CITRATE-CITRIC ACID 500-334 MG/5ML PO SOLN: 30.0000 mL | ORAL | Status: DC | PRN

## 2022-04-28 MED ORDER — LACTATED RINGERS IV SOLN: 500.0000 mL | INTRAVENOUS | Status: DC | PRN

## 2022-04-28 MED ORDER — PRENATAL MULTIVITAMIN CH
1.0000 | ORAL_TABLET | Freq: Every day | ORAL | Status: DC
  Administered 2022-04-28 – 2022-04-29 (×2): 1 via ORAL
  Filled 2022-04-28 (×2): qty 1

## 2022-04-28 MED ORDER — SIMETHICONE 80 MG PO CHEW: 80.0000 mg | CHEWABLE_TABLET | ORAL | Status: DC | PRN

## 2022-04-28 MED ORDER — LACTATED RINGERS IV SOLN: INTRAVENOUS | Status: DC

## 2022-04-28 NOTE — H&P (Signed)
Stephanie Sanford is a 27 y.o. female [phone] at [redacted]w[redacted]d presenting for active labor.  Pregnancy Problem List: Sickle cell trait - FOB negative Anemia H/o hernia repair LGSIL, HPV neg - repeat pap in 10/2022  OB History     Gravida  5   Para  1   Term  1   Preterm  0   AB  3   Living  1      SAB  2   IAB  1   Ectopic      Multiple  0   Live Births  1          Past Medical History:  Diagnosis Date   Mouth ulcers    Past Surgical History:  Procedure Laterality Date   NO PAST SURGERIES     Family History: family history includes Diabetes in her mother; Healthy in her father. Social History:  reports that she has never smoked. She has never used smokeless tobacco. She reports that she does not currently use alcohol. She reports that she does not currently use drugs after having used the following drugs: Marijuana.     Maternal Diabetes: No Genetic Screening: Declined Maternal Ultrasounds/Referrals: Normal Fetal Ultrasounds or other Referrals:  None Maternal Substance Abuse:  No Significant Maternal Medications:  None Significant Maternal Lab Results:  Group B Strep negative Number of Prenatal Visits:greater than 3 verified prenatal visits Other Comments:  None  Review of Systems  All other systems reviewed and are negative.  Maternal Medical History:  Reason for admission: Contractions.   Contractions: Onset was 6-12 hours ago.   Frequency: regular.   Duration is approximately 1 minute.   Perceived severity is moderate.   Fetal activity: Perceived fetal activity is normal.     Dilation: 4 Effacement (%): 80 Station: -2 Exam by:: K.Wilson,RN Blood pressure 123/68, pulse 92, temperature 98.6 F (37 C), resp. rate 18, last menstrual period 04/28/2022. Maternal Exam:  Uterine Assessment: Contraction strength is moderate.  Contraction duration is 1 minute. Contraction frequency is irregular.  Abdomen: Fetal presentation: vertex Introitus: Normal vulva.  Pelvis: adequate for delivery.   Cervix: Cervix evaluated by digital exam.   4/80/-2 per MAU provider  Fetal Exam Fetal Monitor Review: Baseline rate: 135.  Variability: moderate (6-25 bpm).   Pattern: accelerations present and early decelerations.     Physical Exam Vitals reviewed.  Cardiovascular:     Rate and Rhythm: Normal rate.  Pulmonary:     Effort: Pulmonary effort is normal.  Genitourinary:    General: Normal vulva.  Neurological:     General: No focal deficit present.     Mental Status: She is alert and oriented to person, place, and time.  Psychiatric:        Mood and Affect: Mood normal.     Prenatal labs: ABO, Rh: --/--/PENDING (03/18 1025) AB+ (10/21/2021) Antibody: PENDING (03/18 1025) Neg (10/21/2021) Rubella:   Immune (10/21/2021) RPR:   Non-reactive (01/20/2022) HBsAg:   Neg (10/21/2021) HIV:   Non-reactive (01/20/2022) GBS:   Negative (04/01/2022)  Assessment/Plan: 27 y.o. [id number] at [redacted]w[redacted]d  Active Labor Sickle cell trait - FOB negative Anemia H/o hernia repair LGSIL, HPV neg - repeat pap in 10/2022  Admit to L&Stephanie Sanford General admission labs CLD EFM/TOCO per protocol Expectant management Pt/ may have epidural upon request  Stephanie Sanford Stephanie Sanford Stephanie Sanford 04/28/2022, 10:45 AM

## 2022-04-28 NOTE — MAU Note (Signed)
.  Stephanie Sanford is a 27 y.o. at [redacted]w[redacted]d here in MAU reporting: ctx since last night. Reports losing mucus plug an d bloody show denies any leaking. Good fetal movement felt. 1 cm on last exam LMP:  Onset of complaint: last night Pain score: 10 Vitals:   04/28/22 0937  BP: 123/68  Pulse: 92  Resp: 18  Temp: 98.6 F (37 C)     FHT:135 Lab orders placed from triage:

## 2022-04-28 NOTE — Lactation Note (Signed)
This note was copied from a baby's chart. Lactation Consultation Note  Patient Name: Stephanie Sanford [id number] Date: 04/28/2022 Age:28 hours  Reason for consult: Initial assessment;Term P2, GA 40.0  Mother wanting information about flange size fitting for when she starts pumping. Her personal pump has several sizes of flanges. Assisted with measurement.  Mother reports baby has breast fed well since birth. She is motivated to successfully breastfeed this baby. Instructed to call for assistance, as needed, with latching. Instructed to observe baby for feeding cues, place baby skin to skin, and hand express and spoon feed if baby is not latching.   Mother given handout with O/P services, breastfeeding support groups, and our phone # for post-discharge questions.     Maternal Data Has patient been taught Hand Expression?: Yes Does the patient have breastfeeding experience prior to this delivery?: Yes How long did the patient breastfeed?: 3 months (mixed feeding Breast/Bottle)  Feeding Mother's Current Feeding Choice: Breast Milk   Interventions Interventions: Education;LC Services brochure  Discharge Pump: DEBP;Personal (Motif, Spectra)  Consult Status Consult Status: Follow-up Date: 04/29/22 Follow-up type: In-patient   Stana Bunting M 04/28/2022, 6:44 PM

## 2022-04-29 LAB — RPR: RPR Ser Ql: NONREACTIVE

## 2022-04-29 LAB — CBC
HCT: 22.3 % — ABNORMAL LOW (ref 36.0–46.0)
Hemoglobin: 7.1 g/dL — ABNORMAL LOW (ref 12.0–15.0)
MCH: 22.3 pg — ABNORMAL LOW (ref 26.0–34.0)
MCHC: 31.8 g/dL (ref 30.0–36.0)
MCV: 69.9 fL — ABNORMAL LOW (ref 80.0–100.0)
Platelets: 341 10*3/uL (ref 150–400)
RBC: 3.19 MIL/uL — ABNORMAL LOW (ref 3.87–5.11)
RDW: 17.1 % — ABNORMAL HIGH (ref 11.5–15.5)
WBC: 12.3 10*3/uL — ABNORMAL HIGH (ref 4.0–10.5)
nRBC: 0 % (ref 0.0–0.2)

## 2022-04-29 NOTE — Progress Notes (Signed)
PPD# 1 SVD w/ intact perineum Information for the patient's newborn:  Stephanie Sanford, Domina [id number]  female     S:   Reports feeling good Tolerating PO fluid and solids No nausea or vomiting Bleeding is light, clot x1 Pain controlled with acetaminophen and ibuprofen (OTC) Up ad lib / ambulatory / voiding w/o difficulty Feeding: Breast    O:   VS: BP 114/77 (BP Location: Right Arm)   Pulse 81   Temp 98.1 F (36.7 C) (Oral)   Resp 16   Ht 5\' 5"  (1.651 m)   Wt 90.7 kg   LMP 04/28/2022   SpO2 100%   Breastfeeding Unknown   BMI 33.28 kg/m   LABS:  Recent Labs    04/28/22 1026  WBC 11.7*  HGB 9.1*  PLT 436*   Blood type: --/--/AB POS (03/18 1025) Rubella:                        I&O: Intake/Output      03/18 0701 03/19 0700   Urine (mL/kg/hr) 150   Blood 470   Total Output 620   Net -620         Physical Exam: Alert and oriented X3 Lungs: Clear and unlabored Heart: regular rate and rhythm / no mumurs Abdomen: soft, non-tender, non-distended  Fundus: firm, non-tender, U-2 Perineum: intact Lochia: appropriate Extremities: no edema, no calf pain, tenderness, or cords    A:  PPD # 1  Normal exam  P:  Routine post partum orders Anticipate D/C on 04/30/22   Arrie Eastern, DNP, CNM 04/29/2022, 5:37 AM

## 2022-04-30 MED ORDER — IBUPROFEN 600 MG PO TABS: 600.0000 mg | ORAL_TABLET | Freq: Four times a day (QID) | ORAL | 0 refills | Status: AC

## 2022-04-30 MED ORDER — FERROUS GLUCONATE 324 (38 FE) MG PO TABS: 324.0000 mg | ORAL_TABLET | Freq: Every day | ORAL | 3 refills | Status: DC

## 2022-04-30 MED ORDER — SENNOSIDES-DOCUSATE SODIUM 8.6-50 MG PO TABS: 2.0000 | ORAL_TABLET | ORAL | 2 refills | Status: AC

## 2022-04-30 NOTE — Discharge Summary (Signed)
Postpartum Discharge Summary  Date of Service 04/30/2022     Patient Name: Stephanie Sanford DOB: 1996/02/09 MRN: [id number]  Date of admission: 04/28/2022 Delivery date:04/28/2022  Delivering provider: Burman Foster B  Date of discharge: 04/30/2022  Admitting diagnosis: Normal labor [O80, Z37.9] Intrauterine pregnancy: [redacted]w[redacted]d     Secondary diagnosis:  Principal Problem:   Postpartum care following vaginal delivery Active Problems:   Normal labor   SVD (spontaneous vaginal delivery)  Additional problems:  Sickle cell trait - FOB negative  LGSIL, HPV neg - repeat pap in 10/2022     Discharge diagnosis: Term Pregnancy Delivered and Anemia                                              Post partum procedures: none Augmentation: N/A Complications: None  Hospital course: Onset of Labor With Vaginal Delivery      27 y.o. yo [id number] at [redacted]w[redacted]d was admitted in Active Labor on 04/28/2022. Labor course was complicated by precipitous delivery  Membrane Rupture Time/Date: 10:50 AM ,04/28/2022   Delivery Method:Vaginal, Spontaneous  Episiotomy: None  Lacerations:  None  Patient had a postpartum course complicated by none.  She is ambulating, tolerating a regular diet, passing flatus, and urinating well. Patient is discharged home in stable condition on 04/30/22.  Newborn Data: Birth date:04/28/2022  Birth time:10:55 AM  Gender:Female  Living status:Living  Apgars:9 ,9  Weight:3100 g   Magnesium Sulfate received: No BMZ received: No Rhophylac:N/A MMR:N/A Transfusion:No  Physical exam  Vitals:   04/29/22 0518 04/29/22 1350 04/29/22 2043 04/30/22 0253  BP: 114/77 106/79 127/78 130/80  Pulse: 81 82 90 92  Resp: 16  18 18   Temp: 98.1 F (36.7 C) 98.5 F (36.9 C) 98.5 F (36.9 C) 98.5 F (36.9 C)  TempSrc: Oral Oral Oral Oral  SpO2: 100% 100% 100% 100%  Weight:      Height:       General: alert, cooperative, and no distress Lochia: appropriate Uterine Fundus: firm Incision:  N/A DVT Evaluation: No evidence of DVT seen on physical exam. Labs: Lab Results  Component Value Date   WBC 12.3 (H) 04/29/2022   HGB 7.1 (L) 04/29/2022   HCT 22.3 (L) 04/29/2022   MCV 69.9 (L) 04/29/2022   PLT 341 04/29/2022      Latest Ref Rng & Units 04/04/2014    8:36 PM  CMP  Glucose 70 - 99 mg/dL 88   BUN 6 - 23 mg/dL 7   Creatinine 0.50 - 1.10 mg/dL 0.87   Sodium 135 - 145 mmol/L 136   Potassium 3.5 - 5.1 mmol/L 3.7   Chloride 96 - 112 mmol/L 102   CO2 19 - 32 mmol/L 25   Calcium 8.4 - 10.5 mg/dL 9.9    Edinburgh Score:    04/29/2022    8:43 PM  Edinburgh Postnatal Depression Scale Screening Tool  I have been able to laugh and see the funny side of things. 0  I have looked forward with enjoyment to things. 0  I have blamed myself unnecessarily when things went wrong. 0  I have been anxious or worried for no good reason. 0  I have felt scared or panicky for no good reason. 0  Things have been getting on top of me. 0  I have been so unhappy that I have had difficulty sleeping. 0  I have felt sad or miserable. 0  I have been so unhappy that I have been crying. 0  The thought of harming myself has occurred to me. 0  Edinburgh Postnatal Depression Scale Total 0      After visit meds:  Allergies as of 04/30/2022       Reactions   Beef-derived Products    religious   Pork Allergy    Religious    Tomato Hives, Swelling, Other (See Comments)        Medication List     STOP taking these medications    acetaminophen 325 MG tablet Commonly known as: TYLENOL   acyclovir 400 MG tablet Commonly known as: ZOVIRAX   magic mouthwash w/lidocaine Soln   triamcinolone 0.1 % paste Commonly known as: KENALOG       TAKE these medications    ferrous gluconate 324 MG tablet Commonly known as: FERGON Take 1 tablet (324 mg total) by mouth daily with breakfast.   ibuprofen 600 MG tablet Commonly known as: ADVIL Take 1 tablet (600 mg total) by mouth every 6  (six) hours.   senna-docusate 8.6-50 MG tablet Commonly known as: Senokot-S Take 2 tablets by mouth daily.         Discharge home in stable condition Infant Feeding: Breast and pumping Infant Disposition:home with mother Discharge instruction: per After Visit Summary and Postpartum booklet. Activity: Advance as tolerated. Pelvic rest for 6 weeks.  Diet: routine diet Anticipated Birth Control: Unsure Postpartum Appointment:6 weeks Additional Postpartum F/U:  none Future Appointments: Future Appointments  Date Time Provider [place]  05/11/2022  7:15 AM [facility] [place] None   Follow up Visit:  [facility]. Schedule an appointment as soon as possible for a visit in 6 week(s).   Specialty: Obstetrics and Gynecology Why: As needed, If symptoms worsen Contact information: [address] [place] [phone]                    04/30/2022 Josefine Class, MD

## 2022-04-30 NOTE — Lactation Note (Addendum)
This note was copied from a baby's chart. Lactation Consultation Note  Patient Name: Stephanie Sanford [id number] Date: 04/30/2022 Age:28 hours Reason for consult: Follow-up assessment  P2, Mother is breastfeeding and supplementing with her milk.  She is pumping 40 ml per session.  Feed on demand with cues.  Goal 8-12+ times per day after first 24 hrs.  Reviewed engorgement care and monitoring voids/stools. Mother has DEBP and hands free pump at home. Her nipples are tender. Suggest deep latch and applying coconut oil/ebm.   Maternal Data Has patient been taught Hand Expression?: Yes  Feeding Mother's Current Feeding Choice: Breast Milk  Lactation Tools Discussed/Used Tools: Pump Breast pump type: Double-Electric Breast Pump;Manual Pumped volume: 40 mL  Interventions Interventions: Education;DEBP  Discharge Discharge Education: Engorgement and breast care;Warning signs for feeding baby Pump: Personal  Consult Status Consult Status: Complete Date: 04/30/22    Vivianne Master [facility] 04/30/2022, 10:57 AM

## 2022-05-05 ENCOUNTER — Telehealth (HOSPITAL_COMMUNITY): Payer: Self-pay | Admitting: *Deleted

## 2022-05-05 NOTE — Telephone Encounter (Signed)
Preadmission screen  

## 2022-05-06 ENCOUNTER — Encounter (HOSPITAL_COMMUNITY): Payer: Self-pay

## 2022-05-09 ENCOUNTER — Telehealth (HOSPITAL_COMMUNITY): Payer: Self-pay | Admitting: *Deleted

## 2022-05-09 NOTE — Telephone Encounter (Signed)
Left phone voicemail message.  Odis Hollingshead, RN 05-09-2022 at 12:59pm

## 2022-05-11 ENCOUNTER — Inpatient Hospital Stay (HOSPITAL_COMMUNITY)
Admission: RE | Admit: 2022-05-11 | Payer: Medicaid Other | Source: Home / Self Care | Admitting: Obstetrics and Gynecology

## 2022-05-11 ENCOUNTER — Inpatient Hospital Stay (HOSPITAL_COMMUNITY): Payer: Medicaid Other

## 2023-02-09 ENCOUNTER — Ambulatory Visit: Payer: Self-pay | Admitting: General Surgery

## 2023-02-09 MED ORDER — KETOROLAC TROMETHAMINE 15 MG/ML IJ SOLN: 15.0000 mg | INTRAMUSCULAR | Status: AC

## 2023-02-10 ENCOUNTER — Telehealth: Payer: Self-pay | Admitting: Plastic Surgery

## 2023-02-10 NOTE — Telephone Encounter (Signed)
 Consult, from Dr Carolynne Edouard, Mass of upper inner quadrant of R breast n63.12 , called and sch pt with Dr Ulice Bold, thank you

## 2023-02-27 ENCOUNTER — Encounter: Payer: Self-pay | Admitting: Plastic Surgery

## 2023-02-27 ENCOUNTER — Ambulatory Visit (INDEPENDENT_AMBULATORY_CARE_PROVIDER_SITE_OTHER): Payer: Medicaid Other | Admitting: Plastic Surgery

## 2023-02-27 VITALS — BP 123/71 | HR 109 | Ht 65.0 in | Wt 193.0 lb

## 2023-02-27 DIAGNOSIS — N6489 Other specified disorders of breast: Secondary | ICD-10-CM

## 2023-02-27 DIAGNOSIS — N6310 Unspecified lump in the right breast, unspecified quadrant: Secondary | ICD-10-CM

## 2023-02-27 NOTE — Progress Notes (Signed)
     Patient ID: Stephanie Sanford, female    DOB: 03/27/1995, 28 y.o.   MRN: [id number]   Chief Complaint  Patient presents with   consult   Breast Cancer    The patient is a 28 year old female here for breast consultation for reconstruction.  She had a hamartoma of the right breast for about 6 years.  It was biopsied several times and declared to be benign.  It started to change over the last 6 months and get larger.  It is now around 15 cm in size.  She does not have a family history of breast cancer.  She is 5 feet 5 inches tall and weighs 191 pounds.  The lesion is quite large and painful.  It is a significant portion of her right breast.     Review of Systems  Constitutional: Negative.   HENT: Negative.    Eyes: Negative.   Respiratory: Negative.    Cardiovascular: Negative.   Gastrointestinal: Negative.   Endocrine: Negative.   Genitourinary: Negative.   Musculoskeletal: Negative.     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  Constitutional:      Appearance: Normal appearance.  HENT:     Head: Atraumatic.  Cardiovascular:     Rate and Rhythm: Normal rate.     Pulses: Normal pulses.  Pulmonary:     Effort: Pulmonary effort is normal.  Abdominal:     Palpations: Abdomen is soft.  Skin:    General: Skin is warm.     Capillary Refill: Capillary refill takes less than 2  seconds.  Neurological:     Mental Status: She is alert and oriented to person, place, and time.  Psychiatric:        Mood and Affect: Mood normal.        Behavior: Behavior normal.        Thought Content: Thought content normal.        Judgment: Judgment normal.     Assessment & Plan:  Mass of right breast, unspecified quadrant  I have discussed with Dr. Carolynne Edouard and we will plan for bilateral breast reduction for reconstruction after tumor resection to the right.  The patient is aware she would probably not be able to breast-feed in the future.  She will be quite a bit smaller.  The downside to waiting is that she could lose more breast tissue than if she does it sooner.  Pictures were obtained of the patient and placed in the chart with the patient's or guardian's permission.   Alena Bills Lakela Kuba, DO

## 2023-03-24 ENCOUNTER — Telehealth: Payer: Self-pay | Admitting: *Deleted

## 2023-03-24 NOTE — Telephone Encounter (Signed)
Spoke with patient to schedule surgery related appointments.

## 2023-03-30 ENCOUNTER — Ambulatory Visit (INDEPENDENT_AMBULATORY_CARE_PROVIDER_SITE_OTHER): Payer: Medicaid Other | Admitting: Student

## 2023-03-30 ENCOUNTER — Encounter: Payer: Self-pay | Admitting: Student

## 2023-03-30 VITALS — BP 126/75 | HR 99 | Ht 65.0 in | Wt 193.4 lb

## 2023-03-30 DIAGNOSIS — N6310 Unspecified lump in the right breast, unspecified quadrant: Secondary | ICD-10-CM

## 2023-03-30 MED ORDER — ONDANSETRON HCL 4 MG PO TABS: 4.0000 mg | ORAL_TABLET | Freq: Three times a day (TID) | ORAL | 0 refills | Status: AC | PRN

## 2023-03-30 MED ORDER — CEPHALEXIN 500 MG PO CAPS: 500.0000 mg | ORAL_CAPSULE | Freq: Four times a day (QID) | ORAL | 0 refills | Status: AC

## 2023-03-30 MED ORDER — OXYCODONE HCL 5 MG PO TABS: 5.0000 mg | ORAL_TABLET | Freq: Four times a day (QID) | ORAL | 0 refills | Status: AC | PRN

## 2023-03-30 NOTE — H&P (View-Only) (Signed)
 Patient ID: Stephanie Sanford, female    DOB: [birth year], 28 y.o.   MRN: [id number]  Chief Complaint  Patient presents with   Pre-op Exam      ICD-10-CM   1. Mass of right breast, unspecified quadrant  N63.10        History of Present Illness: Stephanie Sanford is a 28 y.o.  female  with a history of hamartoma.  She presents for preoperative evaluation for upcoming procedure, bilateral breast reduction for reconstruction after excision of right breast tumor, scheduled for 04/16/2023 with Dr. Ulice Bold.  Of note, she will also be undergoing right breast lumpectomy with Dr. Carolynne Edouard at the same time.  The patient has never had anesthesia before.  She denies any personal family history of breast cancer.  She states that her most recent biopsy was in June 2023 of her right breast tumor.  Patient denies any history of cardiac disease.  She denies taking any blood thinners.  Patient reports she is not a smoker.  Patient denies taking any birth control or hormone replacement.  She denies any history of miscarriages.  She denies any personal family history of blood clots or clotting diseases.  She denies any recent traumas, surgeries, infections.  She denies any history of stroke or heart attack.  She denies any history of Crohn's disease or ulcerative colitis, COPD or asthma.  She denies any history of cancer.  She denies any varicosities to her lower extremities.  She denies any recent fevers, chills or changes in her health.  Summary of Previous Visit: Patient was seen for initial consult by Dr. Ulice Bold on 02/27/2023.  Patient had a hamartoma of the right breast for about 6 years.  It was biopsied several times and declared to be benign.  It started to get larger over the past 6 months and was now approximately 15 cm in size.  Plan was for bilateral breast reduction for reconstruction after tumor was resection with Dr. Carolynne Edouard.  Job: Works as a Scientist, research (medical), planning to take 2 weeks off  PMH Significant for:  Breast mass, sickle cell trait, anemia  Patient states that she has history of anemia, and her hemoglobin tends to run between 9 and 9.5.  Her most recent hemoglobin per chart review was last March and it was 9.1.  Patient denies any issues with her anemia recently.   Past Medical History: Allergies: Allergies  Allergen Reactions   Beef-Derived Drug Products     religious   Pork Allergy     Religious    Tomato Hives, Swelling and Other (See Comments)    Current Medications:  Current Outpatient Medications:    ferrous gluconate (FERGON) 324 MG tablet, Take 1 tablet (324 mg total) by mouth daily with breakfast. (Patient not taking: Reported on 03/30/2023), Disp: 90 tablet, Rfl: 3   ibuprofen (ADVIL) 600 MG tablet, Take 1 tablet (600 mg total) by mouth every 6 (six) hours. (Patient not taking: Reported on 03/30/2023), Disp: 30 tablet, Rfl: 0  Past Medical Problems: Past Medical History:  Diagnosis Date   LGSIL on Pap smear of cervix 07/10/2016   Colpo postpartum   Mouth ulcers    Oral herpes 07/10/2016    Past Surgical History: Past Surgical History:  Procedure Laterality Date   NO PAST SURGERIES      Social History: Social History   Socioeconomic History   Marital status: Single    Spouse name: Not on file   Number of children: Not on file  Years of education: Not on file   Highest education level: Not on file  Occupational History   Not on file  Tobacco Use   Smoking status: Never   Smokeless tobacco: Never  Vaping Use   Vaping status: Never Used  Substance and Sexual Activity   Alcohol use: Not Currently   Drug use: Not Currently    Types: Marijuana   Sexual activity: Yes    Partners: Male    Birth control/protection: None  Other Topics Concern   Not on file  Social History Narrative   Not on file   Social Drivers of Health   Financial Resource Strain: Not on file  Food Insecurity: Not on file  Transportation Needs: Not on file  Physical Activity:  Not on file  Stress: Not on file  Social Connections: Not on file  Intimate Partner Violence: Not on file    Family History: Family History  Problem Relation Age of Onset   Diabetes Mother    Healthy Father     Review of Systems: Denies any fevers or chills  Physical Exam: Vital Signs BP 126/75 (BP Location: Left Arm, Patient Position: Sitting, Cuff Size: Normal)   Pulse 99   Ht 5\' 5"  (1.651 m)   Wt 193 lb 6.4 oz (87.7 kg)   SpO2 99%   BMI 32.18 kg/m   Physical Exam  Constitutional:      General: Not in acute distress.    Appearance: Normal appearance. Not ill-appearing.  HENT:     Head: Normocephalic and atraumatic.  Neck:     Musculoskeletal: Normal range of motion.  Cardiovascular:     Rate and Rhythm: Normal rate Pulmonary:     Effort: Pulmonary effort is normal. No respiratory distress.  Musculoskeletal: Normal range of motion.  Skin:    General: Skin is warm and dry.     Findings: No erythema or rash.  Neurological:     Mental Status: Alert and oriented to person, place, and time. Mental status is at baseline.  Psychiatric:        Mood and Affect: Mood normal.        Behavior: Behavior normal.    Assessment/Plan: The patient is scheduled for bilateral breast reduction for reconstruction after excision of right breast tumor with Dr. Ulice Bold.  Risks, benefits, and alternatives of procedure discussed, questions answered and consent obtained.    Smoking Status: Non-smoker; Counseling Given?  N/A Last Mammogram: Patient is unsure when her last mammogram was.  She states that she had a biopsy in June 2023.  She states that she has been following up with Dr. Carolynne Edouard since then.  Discussed possibility of mammogram prior to surgery.  Patient expressed understanding.  Caprini Score: 3; Risk Factors include: BMI > 25, and length of planned surgery. Recommendation for mechanical prophylaxis. Encourage early ambulation.   Pictures obtained: @consult   Post-op Rx  sent to pharmacy: Oxycodone, Zofran, Keflex  Instructed patient to hold ibuprofen at least 1 week prior to surgery.  Patient expressed understanding.  Patient was provided with the General Surgical Risk consent document and Pain Medication Agreement prior to their appointment.  They had adequate time to read through the risk consent documents and Pain Medication Agreement. We also discussed them in person together during this preop appointment. All of their questions were answered to their satisfaction.  Recommended calling if they have any further questions.  Risk consent form and Pain Medication Agreement to be scanned into patient's chart.  The consent was  obtained with risks and complications reviewed which included bleeding, pain, scar, infection and the risk of anesthesia.  The patients questions were answered to the patients expressed satisfaction.    Electronically signed by: Laurena Spies, PA-C 03/30/2023 2:43 PM

## 2023-03-30 NOTE — Progress Notes (Signed)
 Patient ID: Stephanie Sanford, female    DOB: [birth year], 28 y.o.   MRN: [id number]  Chief Complaint  Patient presents with   Pre-op Exam      ICD-10-CM   1. Mass of right breast, unspecified quadrant  N63.10        History of Present Illness: Stephanie Sanford is a 28 y.o.  female  with a history of hamartoma.  She presents for preoperative evaluation for upcoming procedure, bilateral breast reduction for reconstruction after excision of right breast tumor, scheduled for 04/16/2023 with Dr. Ulice Bold.  Of note, she will also be undergoing right breast lumpectomy with Dr. Carolynne Edouard at the same time.  The patient has never had anesthesia before.  She denies any personal family history of breast cancer.  She states that her most recent biopsy was in June 2023 of her right breast tumor.  Patient denies any history of cardiac disease.  She denies taking any blood thinners.  Patient reports she is not a smoker.  Patient denies taking any birth control or hormone replacement.  She denies any history of miscarriages.  She denies any personal family history of blood clots or clotting diseases.  She denies any recent traumas, surgeries, infections.  She denies any history of stroke or heart attack.  She denies any history of Crohn's disease or ulcerative colitis, COPD or asthma.  She denies any history of cancer.  She denies any varicosities to her lower extremities.  She denies any recent fevers, chills or changes in her health.  Summary of Previous Visit: Patient was seen for initial consult by Dr. Ulice Bold on 02/27/2023.  Patient had a hamartoma of the right breast for about 6 years.  It was biopsied several times and declared to be benign.  It started to get larger over the past 6 months and was now approximately 15 cm in size.  Plan was for bilateral breast reduction for reconstruction after tumor was resection with Dr. Carolynne Edouard.  Job: Works as a Scientist, research (medical), planning to take 2 weeks off  PMH Significant for:  Breast mass, sickle cell trait, anemia  Patient states that she has history of anemia, and her hemoglobin tends to run between 9 and 9.5.  Her most recent hemoglobin per chart review was last March and it was 9.1.  Patient denies any issues with her anemia recently.   Past Medical History: Allergies: Allergies  Allergen Reactions   Beef-Derived Drug Products     religious   Pork Allergy     Religious    Tomato Hives, Swelling and Other (See Comments)    Current Medications:  Current Outpatient Medications:    ferrous gluconate (FERGON) 324 MG tablet, Take 1 tablet (324 mg total) by mouth daily with breakfast. (Patient not taking: Reported on 03/30/2023), Disp: 90 tablet, Rfl: 3   ibuprofen (ADVIL) 600 MG tablet, Take 1 tablet (600 mg total) by mouth every 6 (six) hours. (Patient not taking: Reported on 03/30/2023), Disp: 30 tablet, Rfl: 0  Past Medical Problems: Past Medical History:  Diagnosis Date   LGSIL on Pap smear of cervix 07/10/2016   Colpo postpartum   Mouth ulcers    Oral herpes 07/10/2016    Past Surgical History: Past Surgical History:  Procedure Laterality Date   NO PAST SURGERIES      Social History: Social History   Socioeconomic History   Marital status: Single    Spouse name: Not on file   Number of children: Not on file  Years of education: Not on file   Highest education level: Not on file  Occupational History   Not on file  Tobacco Use   Smoking status: Never   Smokeless tobacco: Never  Vaping Use   Vaping status: Never Used  Substance and Sexual Activity   Alcohol use: Not Currently   Drug use: Not Currently    Types: Marijuana   Sexual activity: Yes    Partners: Male    Birth control/protection: None  Other Topics Concern   Not on file  Social History Narrative   Not on file   Social Drivers of Health   Financial Resource Strain: Not on file  Food Insecurity: Not on file  Transportation Needs: Not on file  Physical Activity:  Not on file  Stress: Not on file  Social Connections: Not on file  Intimate Partner Violence: Not on file    Family History: Family History  Problem Relation Age of Onset   Diabetes Mother    Healthy Father     Review of Systems: Denies any fevers or chills  Physical Exam: Vital Signs BP 126/75 (BP Location: Left Arm, Patient Position: Sitting, Cuff Size: Normal)   Pulse 99   Ht 5\' 5"  (1.651 m)   Wt 193 lb 6.4 oz (87.7 kg)   SpO2 99%   BMI 32.18 kg/m   Physical Exam  Constitutional:      General: Not in acute distress.    Appearance: Normal appearance. Not ill-appearing.  HENT:     Head: Normocephalic and atraumatic.  Neck:     Musculoskeletal: Normal range of motion.  Cardiovascular:     Rate and Rhythm: Normal rate Pulmonary:     Effort: Pulmonary effort is normal. No respiratory distress.  Musculoskeletal: Normal range of motion.  Skin:    General: Skin is warm and dry.     Findings: No erythema or rash.  Neurological:     Mental Status: Alert and oriented to person, place, and time. Mental status is at baseline.  Psychiatric:        Mood and Affect: Mood normal.        Behavior: Behavior normal.    Assessment/Plan: The patient is scheduled for bilateral breast reduction for reconstruction after excision of right breast tumor with Dr. Ulice Bold.  Risks, benefits, and alternatives of procedure discussed, questions answered and consent obtained.    Smoking Status: Non-smoker; Counseling Given?  N/A Last Mammogram: Patient is unsure when her last mammogram was.  She states that she had a biopsy in June 2023.  She states that she has been following up with Dr. Carolynne Edouard since then.  Discussed possibility of mammogram prior to surgery.  Patient expressed understanding.  Caprini Score: 3; Risk Factors include: BMI > 25, and length of planned surgery. Recommendation for mechanical prophylaxis. Encourage early ambulation.   Pictures obtained: @consult   Post-op Rx  sent to pharmacy: Oxycodone, Zofran, Keflex  Instructed patient to hold ibuprofen at least 1 week prior to surgery.  Patient expressed understanding.  Patient was provided with the General Surgical Risk consent document and Pain Medication Agreement prior to their appointment.  They had adequate time to read through the risk consent documents and Pain Medication Agreement. We also discussed them in person together during this preop appointment. All of their questions were answered to their satisfaction.  Recommended calling if they have any further questions.  Risk consent form and Pain Medication Agreement to be scanned into patient's chart.  The consent was  obtained with risks and complications reviewed which included bleeding, pain, scar, infection and the risk of anesthesia.  The patients questions were answered to the patients expressed satisfaction.    Electronically signed by: Laurena Spies, PA-C 03/30/2023 2:43 PM

## 2023-03-31 ENCOUNTER — Encounter (HOSPITAL_COMMUNITY): Payer: Self-pay

## 2023-03-31 ENCOUNTER — Ambulatory Visit (HOSPITAL_COMMUNITY)
Admission: RE | Admit: 2023-03-31 | Discharge: 2023-03-31 | Disposition: A | Discharge status: Home / Self Care | Payer: Medicaid Other | Source: Ambulatory Visit | Attending: Family Medicine | Admitting: Family Medicine

## 2023-03-31 VITALS — BP 137/81 | HR 92 | Temp 99.9°F | Resp 18

## 2023-03-31 DIAGNOSIS — N898 Other specified noninflammatory disorders of vagina: Secondary | ICD-10-CM | POA: Insufficient documentation

## 2023-03-31 LAB — POCT URINE PREGNANCY: Preg Test, Ur: NEGATIVE

## 2023-03-31 NOTE — ED Provider Notes (Signed)
 [facility]    CSN: [id number] Arrival date & time: 03/31/23  0907      History   Chief Complaint Chief Complaint  Patient presents with   Vaginal Discharge    HPI Stephanie Sanford is a 28 y.o. female.    Vaginal Discharge  Patient is here today for vaginal d/c and itching for about 3-4 days.  She is having thick, clumpy, white d/c.  No urinary symptoms.  She did have unprotected intercourse.  No known exposure.  Her LMP  was 03/06/23.        Past Medical History:  Diagnosis Date   LGSIL on Pap smear of cervix 07/10/2016   Colpo postpartum   Mouth ulcers    Oral herpes 07/10/2016    Patient Active Problem List   Diagnosis Date Noted   Normal labor 04/28/2022   SVD (spontaneous vaginal delivery) 04/28/2022   Postpartum care following vaginal delivery 04/28/2022   Sickle cell trait (HCC) 07/10/2016   Breast mass, right 07/10/2016    Past Surgical History:  Procedure Laterality Date   NO PAST SURGERIES      OB History     Gravida  5   Para  2   Term  2   Preterm  0   AB  3   Living  2      SAB  2   IAB  1   Ectopic      Multiple  0   Live Births  2            Home Medications    Prior to Admission medications   Medication Sig Start Date End Date Taking? Authorizing Provider  cephALEXin (KEFLEX) 500 MG capsule Take 1 capsule (500 mg total) by mouth 4 (four) times daily for 3 days. 03/30/23 04/02/23  Laurena Spies, PA-C  ferrous gluconate (FERGON) 324 MG tablet Take 1 tablet (324 mg total) by mouth daily with breakfast. Patient not taking: Reported on 03/30/2023 04/30/22 04/30/23  Jackie Plum, MD  ibuprofen (ADVIL) 600 MG tablet Take 1 tablet (600 mg total) by mouth every 6 (six) hours. Patient not taking: Reported on 03/30/2023 04/30/22   Jackie Plum, MD  ondansetron (ZOFRAN) 4 MG tablet Take 1 tablet (4 mg total) by mouth every 8 (eight) hours as needed for up to 20 doses for nausea or vomiting. 03/30/23    Laurena Spies, PA-C  oxyCODONE (ROXICODONE) 5 MG immediate release tablet Take 1 tablet (5 mg total) by mouth every 6 (six) hours as needed for up to 20 doses for severe pain (pain score 7-10). 03/30/23   Laurena Spies, PA-C    Family History Family History  Problem Relation Age of Onset   Diabetes Mother    Healthy Father     Social History Social History   Tobacco Use   Smoking status: Never   Smokeless tobacco: Never  Vaping Use   Vaping status: Never Used  Substance Use Topics   Alcohol use: Not Currently   Drug use: Not Currently    Types: Marijuana     Allergies   Beef-derived drug products, Pork allergy, and Tomato   Review of Systems Review of Systems  Constitutional: Negative.   HENT: Negative.    Respiratory: Negative.    Cardiovascular: Negative.   Gastrointestinal: Negative.   Genitourinary:  Positive for vaginal discharge.     Physical Exam Triage Vital Signs ED Triage Vitals  Encounter Vitals Group  BP 03/31/23 0951 137/81     Systolic BP Percentile --      Diastolic BP Percentile --      Pulse Rate 03/31/23 0951 92     Resp 03/31/23 0951 18     Temp 03/31/23 0951 99.9 F (37.7 C)     Temp Source 03/31/23 0951 Oral     SpO2 03/31/23 0951 98 %     Weight --      Height --      Head Circumference --      Peak Flow --      Pain Score 03/31/23 0952 0     Pain Loc --      Pain Education --      Exclude from Growth Chart --    No data found.  Updated Vital Signs BP 137/81 (BP Location: Right Arm)   Pulse 92   Temp 99.9 F (37.7 C) (Oral)   Resp 18   LMP 03/06/2023   SpO2 98%   Breastfeeding No   Visual Acuity Right Eye Distance:   Left Eye Distance:   Bilateral Distance:    Right Eye Near:   Left Eye Near:    Bilateral Near:     Physical Exam Constitutional:      General: She is not in acute distress.    Appearance: Normal appearance. She is normal weight. She is not ill-appearing or toxic-appearing.   Cardiovascular:     Rate and Rhythm: Normal rate and regular rhythm.  Pulmonary:     Effort: Pulmonary effort is normal.     Breath sounds: Normal breath sounds.  Neurological:     General: No focal deficit present.     Mental Status: She is alert.  Psychiatric:        Mood and Affect: Mood normal.      UC Treatments / Results  Labs (all labs ordered are listed, but only abnormal results are displayed) Labs Reviewed  POCT URINE PREGNANCY  CERVICOVAGINAL ANCILLARY ONLY   UPT negative  EKG   Radiology No results found.  Procedures Procedures (including critical care time)  Medications Ordered in UC Medications - No data to display  Initial Impression / Assessment and Plan / UC Course  I have reviewed the triage vital signs and the nursing notes.  Pertinent labs & imaging results that were available during my care of the patient were reviewed by me and considered in my medical decision making (see chart for details).   Final Clinical Impressions(s) / UC Diagnoses   Final diagnoses:  Vaginal discharge     Discharge Instructions      You were seen today for vaginal discharge.  Your swab will be resulted tomorrow.  You will see this result on mychart, and you will be notified of any positive result for treatment.  Your pregnancy test was negative.     ED Prescriptions   None    PDMP not reviewed this encounter.   Jannifer Franklin, MD 03/31/23 1016

## 2023-03-31 NOTE — ED Triage Notes (Signed)
 Pt c/o vaginal itching and discharge x3-4 days. States has had unprotected intercourse.

## 2023-03-31 NOTE — Discharge Instructions (Addendum)
 You were seen today for vaginal discharge.  Your swab will be resulted tomorrow.  You will see this result on mychart, and you will be notified of any positive result for treatment.  Your pregnancy test was negative.

## 2023-04-01 ENCOUNTER — Telehealth (HOSPITAL_COMMUNITY): Payer: Self-pay

## 2023-04-01 LAB — CERVICOVAGINAL ANCILLARY ONLY
Bacterial Vaginitis (gardnerella): NEGATIVE
Candida Glabrata: NEGATIVE
Candida Vaginitis: POSITIVE — AB
Chlamydia: NEGATIVE
Comment: NEGATIVE
Comment: NEGATIVE
Comment: NEGATIVE
Comment: NEGATIVE
Comment: NEGATIVE
Comment: NORMAL
Neisseria Gonorrhea: NEGATIVE
Trichomonas: NEGATIVE

## 2023-04-01 MED ORDER — FLUCONAZOLE 150 MG PO TABS: 150.0000 mg | ORAL_TABLET | Freq: Once | ORAL | 0 refills | Status: AC

## 2023-04-01 NOTE — Telephone Encounter (Signed)
 Per protocol, pt requires tx with Diflucan.  Rx sent to pharmacy on file.

## 2023-04-16 ENCOUNTER — Other Ambulatory Visit: Payer: Self-pay

## 2023-04-16 ENCOUNTER — Encounter (HOSPITAL_BASED_OUTPATIENT_CLINIC_OR_DEPARTMENT_OTHER): Payer: Self-pay | Admitting: General Surgery

## 2023-04-21 MED ORDER — CHLORHEXIDINE GLUCONATE CLOTH 2 % EX PADS: 6.0000 | MEDICATED_PAD | Freq: Once | CUTANEOUS | Status: DC

## 2023-04-21 NOTE — Progress Notes (Signed)

## 2023-04-22 ENCOUNTER — Ambulatory Visit (HOSPITAL_BASED_OUTPATIENT_CLINIC_OR_DEPARTMENT_OTHER): Admitting: Anesthesiology

## 2023-04-22 ENCOUNTER — Other Ambulatory Visit: Payer: Self-pay

## 2023-04-22 ENCOUNTER — Encounter: Payer: Medicaid Other | Admitting: Student

## 2023-04-22 ENCOUNTER — Ambulatory Visit (HOSPITAL_BASED_OUTPATIENT_CLINIC_OR_DEPARTMENT_OTHER)
Admission: RE | Admit: 2023-04-22 | Discharge: 2023-04-22 | Disposition: A | Discharge status: Home / Self Care | Payer: Medicaid Other | Attending: General Surgery | Admitting: General Surgery

## 2023-04-22 ENCOUNTER — Encounter (HOSPITAL_BASED_OUTPATIENT_CLINIC_OR_DEPARTMENT_OTHER)
Admission: RE | Disposition: A | Discharge status: Home / Self Care | Payer: Self-pay | Source: Home / Self Care | Attending: General Surgery

## 2023-04-22 ENCOUNTER — Encounter (HOSPITAL_BASED_OUTPATIENT_CLINIC_OR_DEPARTMENT_OTHER): Payer: Self-pay | Admitting: General Surgery

## 2023-04-22 DIAGNOSIS — N6489 Other specified disorders of breast: Secondary | ICD-10-CM | POA: Insufficient documentation

## 2023-04-22 DIAGNOSIS — N651 Disproportion of reconstructed breast: Secondary | ICD-10-CM

## 2023-04-22 DIAGNOSIS — Z01818 Encounter for other preprocedural examination: Secondary | ICD-10-CM

## 2023-04-22 DIAGNOSIS — Q859 Phakomatosis, unspecified: Secondary | ICD-10-CM | POA: Diagnosis not present

## 2023-04-22 DIAGNOSIS — N6021 Fibroadenosis of right breast: Secondary | ICD-10-CM | POA: Insufficient documentation

## 2023-04-22 HISTORY — DX: Unspecified lump in unspecified breast: N63.0

## 2023-04-22 HISTORY — PX: BREAST REDUCTION SURGERY: SHX8

## 2023-04-22 HISTORY — PX: BREAST LUMPECTOMY: SHX2

## 2023-04-22 LAB — POCT PREGNANCY, URINE: Preg Test, Ur: NEGATIVE

## 2023-04-22 SURGERY — BREAST LUMPECTOMY
Anesthesia: General | Site: Breast | Laterality: Right

## 2023-04-22 MED ORDER — OXYCODONE HCL 5 MG/5ML PO SOLN: 5.0000 mg | Freq: Once | ORAL | Status: AC | PRN

## 2023-04-22 MED ORDER — VASHE WOUND IRRIGATION OPTIME
TOPICAL | Status: DC | PRN
  Administered 2023-04-22: 34 [oz_av]

## 2023-04-22 MED ORDER — LIDOCAINE-EPINEPHRINE 1 %-1:100000 IJ SOLN
INTRAMUSCULAR | Status: AC
  Filled 2023-04-22: qty 2

## 2023-04-22 MED ORDER — FENTANYL CITRATE (PF) 100 MCG/2ML IJ SOLN
INTRAMUSCULAR | Status: AC
  Filled 2023-04-22: qty 2

## 2023-04-22 MED ORDER — LACTATED RINGERS IV SOLN: INTRAVENOUS | Status: DC

## 2023-04-22 MED ORDER — ONDANSETRON HCL 4 MG/2ML IJ SOLN
INTRAMUSCULAR | Status: DC | PRN
  Administered 2023-04-22: 4 mg via INTRAVENOUS

## 2023-04-22 MED ORDER — KETAMINE HCL 50 MG/5ML IJ SOSY
PREFILLED_SYRINGE | INTRAMUSCULAR | Status: AC
  Filled 2023-04-22: qty 5

## 2023-04-22 MED ORDER — CEFAZOLIN SODIUM-DEXTROSE 2-4 GM/100ML-% IV SOLN: 2.0000 g | INTRAVENOUS | Status: DC

## 2023-04-22 MED ORDER — GABAPENTIN 100 MG PO CAPS
100.0000 mg | ORAL_CAPSULE | ORAL | Status: AC
  Administered 2023-04-22: 100 mg via ORAL

## 2023-04-22 MED ORDER — ROCURONIUM BROMIDE 10 MG/ML (PF) SYRINGE
PREFILLED_SYRINGE | INTRAVENOUS | Status: AC
Start: 2023-04-22 — End: ?
  Filled 2023-04-22: qty 10

## 2023-04-22 MED ORDER — CEFAZOLIN SODIUM-DEXTROSE 2-4 GM/100ML-% IV SOLN
INTRAVENOUS | Status: AC
  Filled 2023-04-22: qty 100

## 2023-04-22 MED ORDER — FENTANYL CITRATE (PF) 100 MCG/2ML IJ SOLN
INTRAMUSCULAR | Status: DC | PRN
  Administered 2023-04-22 (×3): 50 ug via INTRAVENOUS

## 2023-04-22 MED ORDER — PROPOFOL 10 MG/ML IV BOLUS
INTRAVENOUS | Status: AC
  Filled 2023-04-22: qty 20

## 2023-04-22 MED ORDER — GABAPENTIN 100 MG PO CAPS
ORAL_CAPSULE | ORAL | Status: AC
  Filled 2023-04-22: qty 1

## 2023-04-22 MED ORDER — DEXAMETHASONE SODIUM PHOSPHATE 10 MG/ML IJ SOLN
INTRAMUSCULAR | Status: DC | PRN
  Administered 2023-04-22: 5 mg via INTRAVENOUS

## 2023-04-22 MED ORDER — KETAMINE HCL 10 MG/ML IJ SOLN
INTRAMUSCULAR | Status: DC | PRN
  Administered 2023-04-22: 20 mg via INTRAVENOUS
  Administered 2023-04-22: 10 mg via INTRAVENOUS

## 2023-04-22 MED ORDER — CEFAZOLIN SODIUM-DEXTROSE 2-4 GM/100ML-% IV SOLN
2.0000 g | INTRAVENOUS | Status: AC
  Administered 2023-04-22: 2 g via INTRAVENOUS

## 2023-04-22 MED ORDER — SODIUM CHLORIDE (PF) 0.9 % IJ SOLN
INTRAMUSCULAR | Status: AC
  Filled 2023-04-22: qty 20

## 2023-04-22 MED ORDER — OXYCODONE HCL 5 MG PO TABS: 5.0000 mg | ORAL_TABLET | ORAL | Status: DC | PRN

## 2023-04-22 MED ORDER — BUPIVACAINE HCL (PF) 0.25 % IJ SOLN
INTRAMUSCULAR | Status: AC
  Filled 2023-04-22: qty 60

## 2023-04-22 MED ORDER — OXYCODONE HCL 5 MG PO TABS
5.0000 mg | ORAL_TABLET | Freq: Once | ORAL | Status: AC | PRN
  Administered 2023-04-22: 5 mg via ORAL

## 2023-04-22 MED ORDER — LIDOCAINE-EPINEPHRINE 1 %-1:100000 IJ SOLN
INTRAMUSCULAR | Status: DC | PRN
  Administered 2023-04-22: 30 mL

## 2023-04-22 MED ORDER — SODIUM CHLORIDE 0.9% FLUSH: 3.0000 mL | Freq: Two times a day (BID) | INTRAVENOUS | Status: DC

## 2023-04-22 MED ORDER — ACETAMINOPHEN 325 MG PO TABS: 650.0000 mg | ORAL_TABLET | ORAL | Status: DC | PRN

## 2023-04-22 MED ORDER — ROCURONIUM BROMIDE 10 MG/ML (PF) SYRINGE
PREFILLED_SYRINGE | INTRAVENOUS | Status: DC | PRN
  Administered 2023-04-22: 50 mg via INTRAVENOUS

## 2023-04-22 MED ORDER — ACETAMINOPHEN 500 MG PO TABS
1000.0000 mg | ORAL_TABLET | ORAL | Status: AC
  Administered 2023-04-22: 1000 mg via ORAL

## 2023-04-22 MED ORDER — FENTANYL CITRATE (PF) 100 MCG/2ML IJ SOLN
25.0000 ug | INTRAMUSCULAR | Status: DC | PRN
  Administered 2023-04-22: 25 ug via INTRAVENOUS

## 2023-04-22 MED ORDER — ACETAMINOPHEN 500 MG PO TABS
ORAL_TABLET | ORAL | Status: AC
  Filled 2023-04-22: qty 2

## 2023-04-22 MED ORDER — SODIUM CHLORIDE 0.9 % IV SOLN
INTRAVENOUS | Status: DC | PRN
  Administered 2023-04-22: 40 mL

## 2023-04-22 MED ORDER — SUGAMMADEX SODIUM 200 MG/2ML IV SOLN
INTRAVENOUS | Status: DC | PRN
  Administered 2023-04-22: 200 mg via INTRAVENOUS

## 2023-04-22 MED ORDER — PROPOFOL 10 MG/ML IV BOLUS
INTRAVENOUS | Status: DC | PRN
  Administered 2023-04-22: 200 mg via INTRAVENOUS

## 2023-04-22 MED ORDER — SODIUM CHLORIDE 0.9% FLUSH: 3.0000 mL | INTRAVENOUS | Status: DC | PRN

## 2023-04-22 MED ORDER — LIDOCAINE 2% (20 MG/ML) 5 ML SYRINGE
INTRAMUSCULAR | Status: AC
  Filled 2023-04-22: qty 5

## 2023-04-22 MED ORDER — MIDAZOLAM HCL 2 MG/2ML IJ SOLN
INTRAMUSCULAR | Status: DC | PRN
  Administered 2023-04-22: 2 mg via INTRAVENOUS

## 2023-04-22 MED ORDER — MIDAZOLAM HCL 2 MG/2ML IJ SOLN
INTRAMUSCULAR | Status: AC
  Filled 2023-04-22: qty 2

## 2023-04-22 MED ORDER — ACETAMINOPHEN 325 MG RE SUPP: 650.0000 mg | RECTAL | Status: DC | PRN

## 2023-04-22 MED ORDER — EPHEDRINE SULFATE-NACL 50-0.9 MG/10ML-% IV SOSY
PREFILLED_SYRINGE | INTRAVENOUS | Status: DC | PRN
  Administered 2023-04-22: 10 mg via INTRAVENOUS

## 2023-04-22 MED ORDER — LIDOCAINE 2% (20 MG/ML) 5 ML SYRINGE
INTRAMUSCULAR | Status: DC | PRN
  Administered 2023-04-22: 100 mg via INTRAVENOUS

## 2023-04-22 MED ORDER — OXYCODONE HCL 5 MG PO TABS
ORAL_TABLET | ORAL | Status: AC
  Filled 2023-04-22: qty 1

## 2023-04-22 MED ORDER — EPINEPHRINE PF 1 MG/ML IJ SOLN
INTRAMUSCULAR | Status: AC
  Filled 2023-04-22: qty 1

## 2023-04-22 MED ORDER — ONDANSETRON HCL 4 MG/2ML IJ SOLN
INTRAMUSCULAR | Status: AC
  Filled 2023-04-22: qty 2

## 2023-04-22 MED ORDER — BUPIVACAINE LIPOSOME 1.3 % IJ SUSP
INTRAMUSCULAR | Status: AC
  Filled 2023-04-22: qty 20

## 2023-04-22 MED ORDER — DEXAMETHASONE SODIUM PHOSPHATE 10 MG/ML IJ SOLN
INTRAMUSCULAR | Status: AC
Start: 2023-04-22 — End: ?
  Filled 2023-04-22: qty 1

## 2023-04-22 MED ORDER — EPHEDRINE 5 MG/ML INJ
INTRAVENOUS | Status: AC
  Filled 2023-04-22: qty 5

## 2023-04-22 MED ORDER — SODIUM CHLORIDE 0.9 % IV SOLN: 250.0000 mL | INTRAVENOUS | Status: DC | PRN

## 2023-04-22 MED ORDER — LIDOCAINE HCL (PF) 1 % IJ SOLN
INTRAMUSCULAR | Status: AC
  Filled 2023-04-22: qty 60

## 2023-04-22 SURGICAL SUPPLY — 75 items
APPLIER CLIP 9.375 MED OPEN (MISCELLANEOUS) ×2 IMPLANT
BAG DECANTER FOR FLEXI CONT (MISCELLANEOUS) IMPLANT
BINDER BREAST LRG (GAUZE/BANDAGES/DRESSINGS) IMPLANT
BINDER BREAST MEDIUM (GAUZE/BANDAGES/DRESSINGS) IMPLANT
BINDER BREAST XLRG (GAUZE/BANDAGES/DRESSINGS) IMPLANT
BINDER BREAST XXLRG (GAUZE/BANDAGES/DRESSINGS) IMPLANT
BIOPATCH RED 1 DISK 7.0 (GAUZE/BANDAGES/DRESSINGS) IMPLANT
BLADE HEX COATED 2.75 (ELECTRODE) ×2 IMPLANT
BLADE KNIFE PERSONA 10 (BLADE) ×4 IMPLANT
BLADE SURG 15 STRL LF DISP TIS (BLADE) ×2 IMPLANT
CANISTER SUCT 1200ML W/VALVE (MISCELLANEOUS) ×2 IMPLANT
CHLORAPREP W/TINT 26 (MISCELLANEOUS) ×2 IMPLANT
CLEANSER WND VASHE 34 (WOUND CARE) IMPLANT
CLIP APPLIE 9.375 MED OPEN (MISCELLANEOUS) IMPLANT
COVER BACK TABLE 60X90IN (DRAPES) ×2 IMPLANT
COVER MAYO STAND STRL (DRAPES) ×2 IMPLANT
DERMABOND ADVANCED .7 DNX12 (GAUZE/BANDAGES/DRESSINGS) ×4 IMPLANT
DRAIN CHANNEL 19F RND (DRAIN) IMPLANT
DRAPE LAPAROSCOPIC ABDOMINAL (DRAPES) ×2 IMPLANT
DRAPE UTILITY XL STRL (DRAPES) ×2 IMPLANT
DRSG MEPILEX POST OP 4X8 (GAUZE/BANDAGES/DRESSINGS) ×4 IMPLANT
DRSG OPSITE POSTOP 4X12 (GAUZE/BANDAGES/DRESSINGS) IMPLANT
ELECT BLADE 4.0 EZ CLEAN MEGAD (MISCELLANEOUS) ×4 IMPLANT
ELECT COATED BLADE 2.86 ST (ELECTRODE) ×2 IMPLANT
ELECT REM PT RETURN 9FT ADLT (ELECTROSURGICAL) ×4 IMPLANT
ELECTRODE BLDE 4.0 EZ CLN MEGD (MISCELLANEOUS) ×2 IMPLANT
ELECTRODE REM PT RTRN 9FT ADLT (ELECTROSURGICAL) ×2 IMPLANT
EVACUATOR SILICONE 100CC (DRAIN) IMPLANT
GAUZE PAD ABD 8X10 STRL (GAUZE/BANDAGES/DRESSINGS) ×4 IMPLANT
GAUZE SPONGE 4X4 12PLY STRL LF (GAUZE/BANDAGES/DRESSINGS) IMPLANT
GLOVE BIO SURGEON STRL SZ 6.5 (GLOVE) ×6 IMPLANT
GLOVE BIO SURGEON STRL SZ7.5 (GLOVE) ×2 IMPLANT
GLOVE BIOGEL PI IND STRL 7.0 (GLOVE) ×2 IMPLANT
GOWN STRL REUS W/ TWL LRG LVL3 (GOWN DISPOSABLE) ×4 IMPLANT
KIT MARKER MARGIN INK (KITS) IMPLANT
NDL FILTER BLUNT 18X1 1/2 (NEEDLE) ×2 IMPLANT
NDL HYPO 25X1 1.5 SAFETY (NEEDLE) ×2 IMPLANT
NDL SAFETY ECLIPSE 18X1.5 (NEEDLE) IMPLANT
NEEDLE FILTER BLUNT 18X1 1/2 (NEEDLE) ×2 IMPLANT
NEEDLE HYPO 25X1 1.5 SAFETY (NEEDLE) ×4 IMPLANT
NS IRRIG 1000ML POUR BTL (IV SOLUTION) ×2 IMPLANT
PACK BASIN DAY SURGERY FS (CUSTOM PROCEDURE TRAY) ×2 IMPLANT
PAD ALCOHOL SWAB (MISCELLANEOUS) IMPLANT
PAD FOAM SILICONE BACKED (GAUZE/BANDAGES/DRESSINGS) IMPLANT
PENCIL SMOKE EVACUATOR (MISCELLANEOUS) ×2 IMPLANT
PIN SAFETY STERILE (MISCELLANEOUS) IMPLANT
POWDER MYRIAD MORCLLS FINE 500 (Miscellaneous) IMPLANT
PWDR MYRIAD MORCELLS FINE 500 (Miscellaneous) ×2 IMPLANT
SLEEVE SCD COMPRESS KNEE MED (STOCKING) ×2 IMPLANT
SPIKE FLUID TRANSFER (MISCELLANEOUS) ×2 IMPLANT
SPONGE T-LAP 18X18 ~~LOC~~+RFID (SPONGE) ×4 IMPLANT
STRIP SUTURE WOUND CLOSURE 1/2 (MISCELLANEOUS) ×4 IMPLANT
SUT MNCRL AB 4-0 PS2 18 (SUTURE) ×8 IMPLANT
SUT MON AB 3-0 SH27 (SUTURE) ×8 IMPLANT
SUT MON AB 4-0 PC3 18 (SUTURE) ×2 IMPLANT
SUT MON AB 5-0 PS2 18 (SUTURE) IMPLANT
SUT PDS 3-0 CT2 (SUTURE) ×8 IMPLANT
SUT PDS II 3-0 CT2 27 ABS (SUTURE) ×12 IMPLANT
SUT SILK 2 0 SH (SUTURE) ×2 IMPLANT
SUT SILK 3 0 PS 1 (SUTURE) IMPLANT
SUT VICRYL 3-0 CR8 SH (SUTURE) ×2 IMPLANT
SYR 10ML LL (SYRINGE) IMPLANT
SYR 3ML 23GX1 SAFETY (SYRINGE) IMPLANT
SYR 50ML LL SCALE MARK (SYRINGE) IMPLANT
SYR BULB IRRIG 60ML STRL (SYRINGE) ×2 IMPLANT
SYR CONTROL 10ML LL (SYRINGE) ×2 IMPLANT
TAPE MEASURE VINYL STERILE (MISCELLANEOUS) IMPLANT
TOWEL GREEN STERILE FF (TOWEL DISPOSABLE) ×4 IMPLANT
TRAY DSU PREP LF (CUSTOM PROCEDURE TRAY) ×2 IMPLANT
TRAY FAXITRON CT DISP (TRAY / TRAY PROCEDURE) IMPLANT
TUBE CONNECTING 20X1/4 (TUBING) ×2 IMPLANT
TUBING INFILTRATION IT-10001 (TUBING) IMPLANT
TUBING SET GRADUATE ASPIR 12FT (MISCELLANEOUS) IMPLANT
UNDERPAD 30X36 HEAVY ABSORB (UNDERPADS AND DIAPERS) ×4 IMPLANT
YANKAUER SUCT BULB TIP NO VENT (SUCTIONS) ×2 IMPLANT

## 2023-04-22 NOTE — Discharge Instructions (Addendum)

## 2023-04-22 NOTE — Anesthesia Procedure Notes (Signed)
 Procedure Name: Intubation Date/Time: 04/22/2023 8:53 AM  Performed by: Francie Massing, CRNAPre-anesthesia Checklist: Patient identified, Emergency Drugs available, Suction available and Patient being monitored Patient Re-evaluated:Patient Re-evaluated prior to induction Oxygen Delivery Method: Circle system utilized Preoxygenation: Pre-oxygenation with 100% oxygen Induction Type: IV induction Ventilation: Mask ventilation without difficulty Laryngoscope Size: Mac and 4 Grade View: Grade I Tube type: Oral Tube size: 7.0 mm Number of attempts: 1 Airway Equipment and Method: Stylet and Oral airway Placement Confirmation: ETT inserted through vocal cords under direct vision, positive ETCO2 and breath sounds checked- equal and bilateral Secured at: 22 cm Tube secured with: Tape Dental Injury: Teeth and Oropharynx as per pre-operative assessment

## 2023-04-22 NOTE — Anesthesia Postprocedure Evaluation (Signed)
 Anesthesia Post Note  Patient: Redith Drach  Procedure(s) Performed: RIGHT BREAST LUMPECTOMY (Right: Breast) Bilateral breast REDUCTION for reconstruction after excision of right breast tumor. (Bilateral: Breast)     Patient location during evaluation: PACU Anesthesia Type: General Level of consciousness: awake and alert Pain management: pain level controlled Vital Signs Assessment: post-procedure vital signs reviewed and stable Respiratory status: spontaneous breathing, nonlabored ventilation, respiratory function stable and patient connected to nasal cannula oxygen Cardiovascular status: blood pressure returned to baseline and stable Postop Assessment: no apparent nausea or vomiting Anesthetic complications: no  No notable events documented.  Last Vitals:  Vitals:   04/22/23 1130 04/22/23 1146  BP: 114/73 122/76  Pulse: 86 88  Resp: 17 16  Temp:  36.9 C  SpO2: 96% 97%    Last Pain:  Vitals:   04/22/23 1209  TempSrc:   PainSc: 6                  Samira Acero L Khamani Daniely

## 2023-04-22 NOTE — Interval H&P Note (Signed)
 History and Physical Interval Note:  04/22/2023 7:48 AM  Stephanie Sanford  has presented today for surgery, with the diagnosis of RIGHT BREAST HAMARTOMA.  The various methods of treatment have been discussed with the patient and family. After consideration of risks, benefits and other options for treatment, the patient has consented to  Procedure(s): RIGHT BREAST LUMPECTOMY (Right) Bilateral breast REDUCTION for reconstruction after excision of right breast tumor. (Bilateral) as a surgical intervention.  The patient's history has been reviewed, patient examined, no change in status, stable for surgery.  I have reviewed the patient's chart and labs.  Questions were answered to the patient's satisfaction.     Chevis Pretty III

## 2023-04-22 NOTE — H&P (Signed)
 REFERRING PHYSICIAN: Annye Rusk, * PROVIDER: Lindell Noe, MD MRN: [id number] DOB: 02-09-1996 Subjective   Chief Complaint: NEW BREAST  History of Present Illness: Allan Bacigalupi is a 28 y.o. female who is seen today as an office consultation for evaluation of NEW BREAST  We are asked to see the patient in consultation by Dr. Antonietta Jewel to evaluate her for a right breast mass. The patient is a 28 year old white female who has had a hamartoma of the right breast for about 6 years. This has been biopsied on a couple of different occasions and found to be a benign appearing hamartoma. Over the last 6 months the mass has become more painful and has gotten quite a bit larger. It now measures about 15 cm. She has no family history of breast cancer. She is otherwise healthy and does not smoke.  Review of Systems: A complete review of systems was obtained from the patient. I have reviewed this information and discussed as appropriate with the patient. See HPI as well for other ROS.  ROS   Medical History: Past Medical History:  Diagnosis Date  Anemia   Patient Active Problem List  Diagnosis  Mass of upper inner quadrant of right breast   History reviewed. No pertinent surgical history.   No Known Allergies  No current outpatient medications on file prior to visit.   No current facility-administered medications on file prior to visit.   Family History  Problem Relation Age of Onset  Stroke Mother  High blood pressure (Hypertension) Mother  Diabetes Mother  Hyperlipidemia (Elevated cholesterol) Father    Social History   Tobacco Use  Smoking Status Never  Smokeless Tobacco Never    Social History   Socioeconomic History  Marital status: Single  Tobacco Use  Smoking status: Never  Smokeless tobacco: Never  Substance and Sexual Activity  Alcohol use: Never  Drug use: Never   Objective:   Vitals:  BP: 118/64  Pulse: 90  Temp: 36.8 C (98.2 F)   SpO2: 98%  Weight: 86.8 kg (191 lb 6.4 oz)  Height: 165.1 cm (5\' 5" )  PainSc: 0-No pain  PainLoc: Breast   Body mass index is 31.85 kg/m.  Physical Exam Vitals reviewed.  Constitutional:  General: She is not in acute distress. Appearance: Normal appearance.  HENT:  Head: Normocephalic and atraumatic.  Right Ear: External ear normal.  Left Ear: External ear normal.  Nose: Nose normal.  Mouth/Throat:  Mouth: Mucous membranes are moist.  Pharynx: Oropharynx is clear.  Eyes:  General: No scleral icterus. Extraocular Movements: Extraocular movements intact.  Conjunctiva/sclera: Conjunctivae normal.  Pupils: Pupils are equal, round, and reactive to light.  Cardiovascular:  Rate and Rhythm: Normal rate and regular rhythm.  Pulses: Normal pulses.  Heart sounds: Normal heart sounds.  Pulmonary:  Effort: Pulmonary effort is normal. No respiratory distress.  Breath sounds: Normal breath sounds.  Abdominal:  General: Bowel sounds are normal.  Palpations: Abdomen is soft.  Tenderness: There is no abdominal tenderness.  Musculoskeletal:  General: No swelling, tenderness or deformity. Normal range of motion.  Cervical back: Normal range of motion and neck supple.  Skin: General: Skin is warm and dry.  Coloration: Skin is not jaundiced.  Neurological:  General: No focal deficit present.  Mental Status: She is alert and oriented to person, place, and time.  Psychiatric:  Mood and Affect: Mood normal.  Behavior: Behavior normal.     Breast: There is a 10 to 15 cm well-circumscribed palpable mobile  mass in the upper inner quadrant of the right breast. There is no palpable mass in the left breast. There is no palpable axillary, supraclavicular, or cervical lymphadenopathy.  Labs, Imaging and Diagnostic Testing:  Assessment and Plan:   Diagnoses and all orders for this visit:  Mass of upper inner quadrant of right breast - Ambulatory Referral to Plastic Surgery   The  patient appears to have a large hamartoma in the upper inner quadrant of the right breast. Although it is benign given the size and the fact that it is still enlarging my recommendation would be to have this removed. She would also like to have this done. I have discussed with her in detail the risks and benefits of the operation as well as some of the technical aspects and she understands and wishes to proceed. I will refer her to plastic surgery as I think she will need some sort of rearrangement of the breast tissue to fill in the void left behind by the mass so that she has a good cosmetic result. Once she has seen plastics then we can coordinate her care and begin surgical planning.

## 2023-04-22 NOTE — Op Note (Signed)
 Op note:    DATE OF PROCEDURE: 04/22/2023  LOCATION: [facility]  SURGEON: Foster Simpson, DO  ASSISTANT: Keenan Bachelor, PA  PREOPERATIVE DIAGNOSIS Right breast hamartoma Breast asymmetry  POSTOPERATIVE DIAGNOSIS Same as preoperative diagnosis  PROCEDURES 1. Bilateral oncoplastic breast reduction.  Right reduction 90 g, Left reduction 100 g (mass 350 gm)  COMPLICATIONS: None.  DRAINS: none  INDICATIONS FOR PROCEDURE Stephanie Sanford is a 28 y.o. year-old female born on 1996-01-07,with a very large right breast mass.  She was going to be very asymmetry once it was removed so the decision was made for reduction.    MRN: [id number]  CONSENT Informed consent was obtained directly from the patient. The risks, benefits and alternatives were fully discussed. Specific risks including but not limited to bleeding, infection, hematoma, seroma, scarring, pain, nipple necrosis, asymmetry, poor cosmetic results, and need for further surgery were discussed. The patient's questions were answered.  DESCRIPTION OF PROCEDURE  Patient was brought into the operating room and rested on the operating room table in the supine position.  SCDs were placed and appropriate padding was performed.  Antibiotics were given. The patient underwent general anesthesia and the chest was prepped and draped in a sterile fashion.  A timeout was performed and all information was confirmed to be correct by those in the room.  Right side: Preoperative markings were confirmed.  Incision lines were injected with local containing epinephrine. General surgery performed their portion of the case which is dicated separately and included resection of the mass.  A Wise-pattern reduction was performed with an inferior central pedical.  The inferior limb area and periareola area was de-epithelializing. A portion of the inferior lateral breast was resected but not to muscle or fascia in order to help preserve  blood supply. Hemostasis was achieved. Experel and Myriad were placed in the pocket.  The nipple was gently rotated into position and the soft tissue closed with 4-0 Monocryl.  The extra tissue was folded into the previous mass site to provide fill. This was tacked with a 3-0 PDS. The pocket was irrigated and hemostasis confirmed.  The deep tissues were approximated with 3-0 PDS sutures.  The skin was closed with deep dermal 3-0 Monocryl and subcuticular 4-0 Monocryl sutures.  The nipple and skin flaps had good capillary refill at the end of the procedure.    Left side: Preoperative markings were confirmed.  Incision lines were injected with local containing epinephrine.  After waiting for vasoconstriction, the marked lines were incised with a #15 blade.  A Wise-pattern superomedial breast reduction was performed by de-epithelializing the pedicle, using bovie to create the superomedial pedicle, and removing breast tissue from the inferior portion of the breast.  Care was taken to not undermine the breast pedicle. Hemostasis was achieved.  The nipple was gently lifted into position and the soft tissue was closed with 3-0 and 4-0 Monocryl.  The patient was sat upright and size and shape symmetry was confirmed. Experel was placed in the pocket. Hemostasis was confirmed.  The deep tissues were approximated with 3-0 PDS sutures. The skin was closed with deep dermal 3-0 Monocryl and subcuticular 4-0 Monocryl sutures.  Dermabond was applied.  A breast binder and ABDs were placed.  The nipple and skin flaps had good capillary refill at the end of the procedure.  The patient tolerated the procedure well. The patient was allowed to wake from anesthesia and taken to the recovery room in satisfactory condition.  The advanced practice  practitioner (APP) assisted throughout the case.  The APP was essential in retraction and counter traction when needed to make the case progress smoothly.  This retraction and assistance made  it possible to see the tissue plans for the procedure.  The assistance was needed for blood control, tissue re-approximation and assisted with closure of the incision site.

## 2023-04-22 NOTE — Transfer of Care (Signed)
 Immediate Anesthesia Transfer of Care Note  Patient: Stephanie Sanford  Procedure(s) Performed: Procedure(s) (LRB): RIGHT BREAST LUMPECTOMY (Right) Bilateral breast REDUCTION for reconstruction after excision of right breast tumor. (Bilateral)  Patient Location: PACU  Anesthesia Type: General  Level of Consciousness: awake, oriented, sedated and patient cooperative  Airway & Oxygen Therapy: Patient Spontanous Breathing and Patient connected to nasal cannula oxygen  Post-op Assessment: Report given to PACU RN and Post -op Vital signs reviewed and stable  Post vital signs: Reviewed and stable  Complications: No apparent anesthesia complications  Last Vitals:  Vitals Value Taken Time  BP 139/87 04/22/23 1029  Temp    Pulse 98 04/22/23 1029  Resp 20 04/22/23 1029  SpO2 100 % 04/22/23 1029  Vitals shown include unfiled device data.  Last Pain:  Vitals:   04/22/23 0734  TempSrc: Oral  PainSc: 0-No pain      Patients Stated Pain Goal: 7 (04/22/23 0734)  Complications: No notable events documented.

## 2023-04-22 NOTE — Interval H&P Note (Signed)
 History and Physical Interval Note:  04/22/2023 8:14 AM  Stephanie Sanford  has presented today for surgery, with the diagnosis of RIGHT BREAST HAMARTOMA.  The various methods of treatment have been discussed with the patient and family. After consideration of risks, benefits and other options for treatment, the patient has consented to  Procedure(s): RIGHT BREAST LUMPECTOMY (Right) Bilateral breast REDUCTION for reconstruction after excision of right breast tumor. (Bilateral) as a surgical intervention.  The patient's history has been reviewed, patient examined, no change in status, stable for surgery.  I have reviewed the patient's chart and labs.  Questions were answered to the patient's satisfaction.     Alena Bills [name]

## 2023-04-22 NOTE — Anesthesia Preprocedure Evaluation (Addendum)
 Anesthesia Evaluation  Patient identified by MRN, date of birth, ID band Patient awake    Reviewed: Allergy & Precautions, NPO status , Patient's Chart, lab work & pertinent test results  Airway Mallampati: I  TM Distance: >3 FB Neck ROM: Full    Dental no notable dental hx. (+) Teeth Intact   Pulmonary neg pulmonary ROS   Pulmonary exam normal breath sounds clear to auscultation       Cardiovascular negative cardio ROS Normal cardiovascular exam Rhythm:Regular Rate:Normal     Neuro/Psych negative neurological ROS  negative psych ROS   GI/Hepatic negative GI ROS, Neg liver ROS,,,  Endo/Other  negative endocrine ROS    Renal/GU negative Renal ROS  negative genitourinary   Musculoskeletal negative musculoskeletal ROS (+)    Abdominal   Peds  Hematology  (+) Blood dyscrasia, Sickle cell trait and anemia   Anesthesia Other Findings   Reproductive/Obstetrics                             Anesthesia Physical Anesthesia Plan  ASA: 2  Anesthesia Plan: General   Post-op Pain Management: Tylenol PO (pre-op)*, Ketamine IV* and Dilaudid IV   Induction: Intravenous  PONV Risk Score and Plan: 3 and Midazolam, Dexamethasone and Ondansetron  Airway Management Planned: Oral ETT  Additional Equipment:   Intra-op Plan:   Post-operative Plan: Extubation in OR  Informed Consent: I have reviewed the patients History and Physical, chart, labs and discussed the procedure including the risks, benefits and alternatives for the proposed anesthesia with the patient or authorized representative who has indicated his/her understanding and acceptance.     Dental advisory given  Plan Discussed with: CRNA  Anesthesia Plan Comments:        Anesthesia Quick Evaluation

## 2023-04-22 NOTE — Op Note (Signed)
 04/22/2023  10:03 AM  PATIENT:  Stephanie Sanford  28 y.o. female  PRE-OPERATIVE DIAGNOSIS:  RIGHT BREAST HAMARTOMA  POST-OPERATIVE DIAGNOSIS:  RIGHT BREAST HAMARTOMA  PROCEDURE:  Procedure(s): RIGHT BREAST LUMPECTOMY (Right)  SURGEON:  Surgeons and Role: Panel 1:    Griselda Miner, MD - Primary  PHYSICIAN ASSISTANT:   ASSISTANTS: none   ANESTHESIA:   general  EBL:  minimal   BLOOD ADMINISTERED:none  DRAINS: none   LOCAL MEDICATIONS USED:  NONE  SPECIMEN:  Source of Specimen:  right breast tissue  DISPOSITION OF SPECIMEN:  PATHOLOGY  COUNTS:  YES  TOURNIQUET:  * No tourniquets in log *  DICTATION: .Dragon Dictation  After informed consent was obtained the patient was brought to the operating room and placed in the supine position on the operating table.  After adequate induction of general anesthesia the patient's bilateral chest, breast, and axillary areas were prepped with Betadine and draped in usual sterile manner.  An appropriate timeout was performed.  The patient had a large right breast mass that was previously biopsied and found to be a benign hamartoma.  Dr. Ulice Bold marked to the right breast for the reduction.  I made incisions along the upper portion of the right breast where she had been previously marked.  The incision was carried through the skin and subcutaneous tissue sharply with the electrocautery.  The dissection was carried towards the palpable mass.  Once I encountered the mass I then was able to separate the mass from the rest of the breast tissue.  This was done sharply with the electrocautery.  Once the entire mass was removed from the patient then it was oriented with the appropriate paint colors and sent to pathology for further evaluation.  I was able to palpate the rest of the breast tissue and was confident that the entire mass was removed and that no portion of the mass was left behind.  Hemostasis was achieved using the Bovie electrocautery.  At  this point the operation was turned over to Dr. Ulice Bold for the reduction in the left.  The patient was tolerating the procedure well.  At this point all needle sponge and instrument counts were correct.  Dr. Kittie Plater portion will be dictated separately.  The patient was in stable condition.  The mass measured 15 x 10 cm.  PLAN OF CARE: Discharge to home after PACU  PATIENT DISPOSITION:  PACU - hemodynamically stable.   Delay start of Pharmacological VTE agent (>24hrs) due to surgical blood loss or risk of bleeding: not applicable

## 2023-04-23 ENCOUNTER — Encounter (HOSPITAL_BASED_OUTPATIENT_CLINIC_OR_DEPARTMENT_OTHER): Payer: Self-pay | Admitting: General Surgery

## 2023-04-24 LAB — SURGICAL PATHOLOGY

## 2023-04-27 ENCOUNTER — Encounter: Payer: Self-pay | Admitting: General Surgery

## 2023-05-01 ENCOUNTER — Encounter: Payer: Self-pay | Admitting: Plastic Surgery

## 2023-05-01 ENCOUNTER — Ambulatory Visit: Payer: Medicaid Other | Admitting: Plastic Surgery

## 2023-05-01 VITALS — BP 113/68 | HR 102

## 2023-05-01 DIAGNOSIS — N62 Hypertrophy of breast: Secondary | ICD-10-CM

## 2023-05-01 NOTE — Progress Notes (Signed)
 The patient is a 28 year old female here with her friend for follow-up after undergoing bilateral breast reduction.  This was done for oncological purposes because she had a mass as well and this was removed from the right breast.  This is likely why she still has a little more swelling on the right side compared to the left.  Overall she is doing really well and the pathology looked good.  Will plan to see her back in 2 weeks.  She can go into the sports bra.

## 2023-05-05 ENCOUNTER — Encounter: Payer: Medicaid Other | Admitting: Plastic Surgery

## 2023-05-14 ENCOUNTER — Encounter: Payer: Self-pay | Admitting: Student

## 2023-05-14 ENCOUNTER — Ambulatory Visit (INDEPENDENT_AMBULATORY_CARE_PROVIDER_SITE_OTHER): Payer: Self-pay | Admitting: Student

## 2023-05-14 VITALS — BP 114/71 | HR 88 | Ht 65.0 in | Wt 197.0 lb

## 2023-05-14 DIAGNOSIS — N62 Hypertrophy of breast: Secondary | ICD-10-CM

## 2023-05-14 NOTE — Progress Notes (Signed)
 Patient is a 28 year old female with history of a right breast hamartoma.  She most recently underwent right breast lumpectomy with Dr. Carolynne Edouard followed by bilateral oncoplastic breast reduction with Dr. Ulice Bold on 04/22/2023.  Patient is 3 weeks postop.  She presents to the clinic today for postoperative follow-up.  Patient was last seen in the clinic on 05/01/2023.  At this visit, patient was noted to have a little bit more swelling on the right side compared to the left.  Overall patient was doing well and the pathology looked good.  Today, patient reports she is doing well.  She states that she has a little bit of soreness to her inframammary incision where sutures are placed.  She otherwise denies any issues or concerns.  She denies any fevers or chills.  Denies any drainage from either of her breast.  Chaperone present on exam.  On exam, patient is sitting upright in no acute distress.  Breasts are overall soft and fairly symmetric.  There is a little bit of firmness noted to the right medial breast where tumor was taken out.  I suspect that this is a little bit of scar tissue.  There is no overlying erythema to either breast.  No obvious fluid collections to palpation.  NAC's appear to be healthy bilaterally.  Incisions were intact with Steri-Strips.  Steri-Strips were removed without any difficulty.  Incisions appear to be intact and healing well.  There were several suture knots that were cut and removed.  Patient tolerated well.  There are no signs of infection on exam.  Recommended that patient gently massage the area of firmness and keep an eye on it.  Also recommended that she apply Vaseline to her incisions daily throughout.  Patient expressed understanding.  Discussed with patient to continue wearing compression and to avoid strenuous activities.  We will plan to see her back in a few weeks.  I instructed her to call in the meantime she has any questions or concerns about  anything.  Pictures were obtained of the patient and placed in the chart with the patient's or guardian's permission.

## 2023-05-15 ENCOUNTER — Encounter: Payer: Medicaid Other | Admitting: Student

## 2023-05-19 ENCOUNTER — Encounter: Payer: Medicaid Other | Admitting: Student

## 2023-05-29 ENCOUNTER — Ambulatory Visit (INDEPENDENT_AMBULATORY_CARE_PROVIDER_SITE_OTHER): Payer: Medicaid Other | Admitting: Surgical

## 2023-05-29 ENCOUNTER — Encounter: Payer: Self-pay | Admitting: Student

## 2023-05-29 VITALS — BP 118/77 | HR 91

## 2023-05-29 DIAGNOSIS — N6310 Unspecified lump in the right breast, unspecified quadrant: Secondary | ICD-10-CM

## 2023-05-29 DIAGNOSIS — N62 Hypertrophy of breast: Secondary | ICD-10-CM

## 2023-05-29 NOTE — Progress Notes (Signed)
 Patient is a 28 year old female with history of a right breast hamartoma. She most recently underwent right breast lumpectomy with Dr. Alethea Andes followed by bilateral oncoplastic breast reduction with Dr. Orin Birk on 04/22/2023.  She is a little over 5 weeks postop.  She presents to the clinic today for postoperative follow-up.  Patient was last seen in the clinic on 05/14/2023.  At this visit, patient was doing well.    Today patient reports she is overall doing well, she does not have any specific questions or concerns.   Chaperone present on exam On exam bilateral NAC's are viable, bilateral breast incisions are intact and appear to be healing well.  There is no subcutaneous fluid collection noted palpation.  Bilateral breasts are overall symmetric.  There is no erythema or cellulitic changes noted.  No tenderness noted with palpation.  A/P:  Patient is doing really well, there is no signs infection or concern on exam.  Discussed recommendations for compressive garments until 6 weeks postop which would be just about next week.  We discussed that she can transition to wearing compressive garments when active starting 6 weeks postop.  We discussed recommendations for scar creams including various options of silicone-based scar creams and silicone-based scar strips.  All of her questions were answered to her content.    Discussed recommendations for following up as needed, calling with questions or concerns.  Patient was agreeable with this plan, picture was taken and placed in the patient's chart with patient's permission.
